# Patient Record
Sex: Male | Born: 1990 | Race: Black or African American | Hispanic: No | Marital: Single | Smoking: Never smoker
Health system: Southern US, Community
[De-identification: ages and names within clinical notes are randomized; demographics above are authoritative.]

## PROBLEM LIST (undated history)

## (undated) DIAGNOSIS — K219 Gastro-esophageal reflux disease without esophagitis: Secondary | ICD-10-CM

## (undated) DIAGNOSIS — R000 Tachycardia, unspecified: Secondary | ICD-10-CM

## (undated) DIAGNOSIS — E785 Hyperlipidemia, unspecified: Secondary | ICD-10-CM

## (undated) HISTORY — DX: Hyperlipidemia, unspecified: E78.5

## (undated) HISTORY — PX: NO PAST SURGERIES: SHX2092

---
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
## 2009-02-12 ENCOUNTER — Emergency Department (HOSPITAL_BASED_OUTPATIENT_CLINIC_OR_DEPARTMENT_OTHER): Admission: EM | Admit: 2009-02-12 | Discharge: 2009-02-12 | Payer: Self-pay | Admitting: Emergency Medicine

## 2009-02-12 ENCOUNTER — Ambulatory Visit: Payer: Self-pay | Admitting: Diagnostic Radiology

## 2009-03-01 ENCOUNTER — Emergency Department (HOSPITAL_BASED_OUTPATIENT_CLINIC_OR_DEPARTMENT_OTHER): Admission: EM | Admit: 2009-03-01 | Discharge: 2009-03-01 | Payer: Self-pay | Admitting: Emergency Medicine

## 2009-03-01 ENCOUNTER — Ambulatory Visit: Payer: Self-pay | Admitting: Diagnostic Radiology

## 2009-11-13 ENCOUNTER — Emergency Department (HOSPITAL_BASED_OUTPATIENT_CLINIC_OR_DEPARTMENT_OTHER): Admission: EM | Admit: 2009-11-13 | Discharge: 2009-11-13 | Payer: Self-pay | Admitting: Emergency Medicine

## 2010-02-07 ENCOUNTER — Emergency Department (HOSPITAL_BASED_OUTPATIENT_CLINIC_OR_DEPARTMENT_OTHER)
Admission: EM | Admit: 2010-02-07 | Discharge: 2010-02-07 | Payer: Self-pay | Source: Home / Self Care | Admitting: Emergency Medicine

## 2010-05-09 LAB — BASIC METABOLIC PANEL
CO2: 30 mEq/L (ref 19–32)
Chloride: 106 mEq/L (ref 96–112)
GFR calc Af Amer: >60 mL/min (ref >60)
GFR calc non Af Amer: >60 mL/min (ref >60)
Glucose, Bld: 82 mg/dL (ref 70–99)
Sodium: 143 mEq/L (ref 135–145)

## 2010-06-01 LAB — CBC
MCHC: 33.8 g/dL (ref 30.0–36.0)
RBC: 5.19 MIL/uL (ref 4.22–5.81)
RDW: 12.7 % (ref 11.5–15.5)

## 2010-06-01 LAB — DIFFERENTIAL
Eosinophils Relative: 2 % (ref 0–5)
Lymphocytes Relative: 32 % (ref 12–46)
Lymphs Abs: 1.2 10*3/uL (ref 0.7–4.0)

## 2010-06-01 LAB — BASIC METABOLIC PANEL
BUN: 16 mg/dL (ref 6–23)
Calcium: 9.6 mg/dL (ref 8.4–10.5)
Chloride: 104 mEq/L (ref 96–112)
Creatinine, Ser: 1 mg/dL (ref 0.4–1.5)
Glucose, Bld: 93 mg/dL (ref 70–99)
Potassium: 4.2 mEq/L (ref 3.5–5.1)

## 2011-08-01 ENCOUNTER — Ambulatory Visit (INDEPENDENT_AMBULATORY_CARE_PROVIDER_SITE_OTHER): Payer: Managed Care, Other (non HMO) | Admitting: Family

## 2011-08-01 ENCOUNTER — Encounter: Payer: Self-pay | Admitting: Family

## 2011-08-01 VITALS — BP 98/52 | HR 60 | Temp 98.2°F | Resp 16 | Ht 69.0 in | Wt 128.1 lb

## 2011-08-01 DIAGNOSIS — Z23 Encounter for immunization: Secondary | ICD-10-CM

## 2011-08-01 DIAGNOSIS — Z0000 Encounter for general adult medical examination without abnormal findings: Secondary | ICD-10-CM | POA: Insufficient documentation

## 2011-08-01 DIAGNOSIS — H6120 Impacted cerumen, unspecified ear: Secondary | ICD-10-CM

## 2011-08-01 NOTE — Assessment & Plan Note (Signed)
Left ear.  Cerumen removed with curette and lavage.

## 2011-08-01 NOTE — Progress Notes (Signed)
  Subjective:    Patient ID: Eric Cisneros, male    DOB: 07/22/1990, 21 y.o.   MRN: [id number]  HPI  Preventative-  Diet is good.  Eats Not sure when his last tetanus was.  Was told that his cholesterol was high.      Review of Systems  Constitutional: Negative for unexpected weight change.  HENT: Negative for hearing loss.   Eyes: Negative for visual disturbance.  Respiratory: Negative for cough and shortness of breath.   Cardiovascular: Negative for chest pain and leg swelling.  Gastrointestinal: Negative for nausea, vomiting and diarrhea.  Genitourinary: Negative for frequency and hematuria.  Musculoskeletal: Negative for myalgias and arthralgias.  Skin: Negative for rash.  Neurological:       Occasionally right lower leg "falls asleep" when he wakes up in AM, last episode 1 month ago  Occasional headaches relieved by asa  Hematological: Negative for adenopathy.  Psychiatric/Behavioral:       Denies depression/anxiety       Objective:   Physical Exam Physical Exam  Constitutional: He is oriented to person, place, and time. He appears well-developed and well-nourished. No distress.  HENT:  Head: Normocephalic and atraumatic.  Right Ear: Tympanic membrane and ear canal normal.  Left Ear: Cerumen impaction noted.  After removal of cerumen with currette and lavage, a normal tympanic membrane and ear canal are revealed.  Mouth/Throat: Oropharynx is clear and moist.  Eyes: Pupils are equal, round, and reactive to light. No scleral icterus.  Neck: Normal range of motion. No thyromegaly present.  Cardiovascular: Normal rate and regular rhythm.   No murmur heard. Pulmonary/Chest: Effort normal and breath sounds normal. No respiratory distress. He has no wheezes. He has no rales. He exhibits no tenderness.  Abdominal: Soft. Bowel sounds are normal. He exhibits no distension and no mass. There is no tenderness. There is no rebound and no guarding.  Musculoskeletal: He exhibits no  edema.  Lymphadenopathy:    He has no cervical adenopathy.  Neurological: He is alert and oriented to person, place, and time. He has normal reflexes. He exhibits normal muscle tone. Coordination normal.  Skin: Skin is warm and dry.  Psychiatric: He has a normal mood and affect. His behavior is normal. Judgment and thought content normal.          Assessment & Plan:          Assessment & Plan:  \

## 2011-08-01 NOTE — Assessment & Plan Note (Signed)
Pt counseled on healthy diet, exercise.  Tetanus today.  Pt will return for fasting lab work.

## 2011-08-01 NOTE — Patient Instructions (Signed)
Please work on a low cholesterol diet. Please return fasting for your blood work one morning next week.  Follow up in 1 year, sooner if problems/concerns.

## 2011-08-05 ENCOUNTER — Telehealth: Payer: Self-pay | Admitting: *Deleted

## 2011-08-05 DIAGNOSIS — Z0000 Encounter for general adult medical examination without abnormal findings: Secondary | ICD-10-CM

## 2011-08-05 NOTE — Telephone Encounter (Signed)
Message copied by Kathi Simpers on Tue Aug 05, 2011  9:40 AM ------      Message from: O'SULLIVAN, MELISSA      Created: Fri Aug 01, 2011  3:22 PM       Pt will return next week for:            BMET, LFT, FLP, CBC, TSH, UA with reflex micro (v70.0) please

## 2011-08-05 NOTE — Telephone Encounter (Signed)
Future lab order placed and given to the lab. 

## 2014-06-26 ENCOUNTER — Telehealth: Payer: Self-pay | Admitting: Family

## 2014-06-26 NOTE — Telephone Encounter (Signed)
Pt was last seen by Melissa O. In 2013 and requesting to establish care with a male physicians. Please advise

## 2014-06-26 NOTE — Telephone Encounter (Signed)
Ok with me 

## 2014-06-26 NOTE — Telephone Encounter (Signed)
That is fine with me.

## 2014-06-29 ENCOUNTER — Telehealth: Payer: Self-pay | Admitting: *Deleted

## 2014-06-29 NOTE — Telephone Encounter (Signed)
Unable to reach patient at time of Pre-Visit Call.    Called cell phone number and a Mr. Smith answered- wrong #. Called house number and left message for return call.

## 2014-06-30 ENCOUNTER — Ambulatory Visit: Payer: Managed Care, Other (non HMO) | Admitting: Medical

## 2014-07-03 ENCOUNTER — Encounter: Payer: Self-pay | Admitting: Medical

## 2014-07-03 ENCOUNTER — Encounter: Payer: Self-pay | Admitting: Family

## 2014-07-03 ENCOUNTER — Telehealth: Payer: Self-pay | Admitting: Family

## 2014-07-03 NOTE — Telephone Encounter (Signed)
Pt was no show for new pt appointment with Esperanza RichtersEdward Saguier on 06/30/14. Letter sent.

## 2014-07-19 ENCOUNTER — Encounter: Payer: Managed Care, Other (non HMO) | Admitting: Family

## 2014-07-20 ENCOUNTER — Telehealth: Payer: Self-pay | Admitting: *Deleted

## 2014-07-20 NOTE — Telephone Encounter (Signed)
Unable to reach patient at time of Pre-Visit Call.  Called cell phone and a man stated that it was the wrong number.   Called home phone and it was a woman's answering machine with no mention of shared phone.   Did not leave voicemail.

## 2014-07-21 ENCOUNTER — Encounter: Payer: Managed Care, Other (non HMO) | Admitting: Family

## 2014-07-21 ENCOUNTER — Ambulatory Visit (INDEPENDENT_AMBULATORY_CARE_PROVIDER_SITE_OTHER): Payer: Self-pay | Admitting: Physician Assistant

## 2014-07-21 ENCOUNTER — Encounter: Payer: Self-pay | Admitting: Physician Assistant

## 2014-07-21 VITALS — BP 120/57 | HR 50 | Temp 98.3°F | Ht 69.25 in | Wt 129.0 lb

## 2014-07-21 DIAGNOSIS — Z0184 Encounter for antibody response examination: Secondary | ICD-10-CM

## 2014-07-21 DIAGNOSIS — Z0000 Encounter for general adult medical examination without abnormal findings: Secondary | ICD-10-CM

## 2014-07-21 LAB — CBC WITH DIFFERENTIAL/PLATELET
BASOS ABS: 0 10*3/uL (ref 0.0–0.1)
BASOS PCT: 1 % (ref 0–1)
EOS ABS: 0.1 10*3/uL (ref 0.0–0.7)
EOS PCT: 2 % (ref 0–5)
HCT: 43.6 % (ref 39.0–52.0)
Hemoglobin: 14.5 g/dL (ref 13.0–17.0)
Lymphocytes Relative: 32 % (ref 12–46)
Lymphs Abs: 1.4 10*3/uL (ref 0.7–4.0)
MCH: 27.8 pg (ref 26.0–34.0)
MCHC: 33.3 g/dL (ref 30.0–36.0)
MCV: 83.7 fL (ref 78.0–100.0)
MONO ABS: 0.4 10*3/uL (ref 0.1–1.0)
MPV: 10.9 fL (ref 8.6–12.4)
Monocytes Relative: 9 % (ref 3–12)
NEUTROS ABS: 2.5 10*3/uL (ref 1.7–7.7)
Neutrophils Relative %: 56 % (ref 43–77)
PLATELETS: 179 10*3/uL (ref 150–400)
RBC: 5.21 MIL/uL (ref 4.22–5.81)
RDW: 14.5 % (ref 11.5–15.5)
WBC: 4.4 10*3/uL (ref 4.0–10.5)

## 2014-07-21 LAB — LIPID PANEL
CHOLESTEROL: 239 mg/dL — AB (ref 0–200)
HDL: 64 mg/dL (ref >=40)
LDL CALC: 159 mg/dL — AB (ref 0–99)
TRIGLYCERIDES: 81 mg/dL (ref <150)
Total CHOL/HDL Ratio: 3.7 Ratio
VLDL: 16 mg/dL (ref 0–40)

## 2014-07-21 LAB — COMPREHENSIVE METABOLIC PANEL
ALT: 13 U/L (ref 0–53)
AST: 21 U/L (ref 0–37)
Albumin: 4.3 g/dL (ref 3.5–5.2)
Alkaline Phosphatase: 52 U/L (ref 39–117)
BUN: 11 mg/dL (ref 6–23)
CALCIUM: 9.4 mg/dL (ref 8.4–10.5)
CHLORIDE: 103 meq/L (ref 96–112)
CO2: 27 mEq/L (ref 19–32)
CREATININE: 0.98 mg/dL (ref 0.50–1.35)
GLUCOSE: 95 mg/dL (ref 70–99)
Potassium: 4.2 mEq/L (ref 3.5–5.3)
SODIUM: 136 meq/L (ref 135–145)
Total Bilirubin: 0.5 mg/dL (ref 0.2–1.2)
Total Protein: 7.3 g/dL (ref 6.0–8.3)

## 2014-07-21 NOTE — Addendum Note (Signed)
Addended by: Silvio PateHOMPSON, Aalina Brege D on: 07/21/2014 04:48 PM   Modules accepted: Orders

## 2014-07-21 NOTE — Progress Notes (Signed)
Pre visit review using our clinic review tool, if applicable. No additional management support is needed unless otherwise documented below in the visit note. 

## 2014-07-21 NOTE — Progress Notes (Signed)
Patient presents to clinic today today for college physical assessment and immunity status testing.  Patient with history of high cholesterol controlled with diet and exercise.  Denies acute concerns today.  Moved from [place] in 2008 and has no immunization records at home or on file. Will be starting school in August.  Past Medical History  Diagnosis Date  . Hyperlipidemia     No current outpatient prescriptions on file prior to visit.   No current facility-administered medications on file prior to visit.    No Known Allergies  Family History  Problem Relation Age of Onset  . Hyperlipidemia Mother   . Hypertension Father     History   Social History  . Marital Status: Single    Spouse Name: N/A  . Number of Children: N/A  . Years of Education: N/A   Social History Main Topics  . Smoking status: Never Smoker   . Smokeless tobacco: Never Used  . Alcohol Use: No  . Drug Use: No  . Sexual Activity:    Partners: Female     Comment: 3 partners last 6 months. uses condoms   Other Topics Concern  . None   Social History Narrative   Lives with parents, 2 sisters and niece   Moved here from [place] at a hotel resort.    Completed HS   Enjoys basketball   Enjoys writing.    Review of Systems  Constitutional: Negative for fever and weight loss.  HENT: Negative for ear discharge, ear pain, hearing loss and tinnitus.   Eyes: Negative for blurred vision, double vision, photophobia and pain.  Respiratory: Negative for cough and shortness of breath.   Cardiovascular: Negative for chest pain and palpitations.  Gastrointestinal: Negative for heartburn, nausea, vomiting, abdominal pain, diarrhea, constipation, blood in stool and melena.  Genitourinary: Negative for dysuria, urgency, frequency, hematuria and flank pain.  Musculoskeletal: Negative for falls.  Neurological: Negative for dizziness, loss of consciousness and headaches.  Endo/Heme/Allergies: Negative for  environmental allergies.  Psychiatric/Behavioral: Negative for depression, suicidal ideas, hallucinations and substance abuse. The patient is not nervous/anxious and does not have insomnia.      BP 120/57 mmHg  Pulse 50  Temp(Src) 98.3 F (36.8 C) (Oral)  Ht 5' 9.25" (1.759 m)  Wt 129 lb (58.514 kg)  BMI 18.91 kg/m2  SpO2 100%  Physical Exam  Constitutional: He is oriented to person, place, and time and well-developed, well-nourished, and in no distress.  HENT:  Head: Normocephalic and atraumatic.  Right Ear: External ear normal.  Left Ear: External ear normal.  Nose: Nose normal.  Mouth/Throat: Oropharynx is clear and moist. No oropharyngeal exudate.  Eyes: Conjunctivae and EOM are normal. Pupils are equal, round, and reactive to light.  Neck: Neck supple. No thyromegaly present.  Cardiovascular: Normal rate, regular rhythm, normal heart sounds and intact distal pulses.   Pulmonary/Chest: Effort normal and breath sounds normal. No respiratory distress. He has no wheezes. He has no rales. He exhibits no tenderness.  Abdominal: Soft. Bowel sounds are normal. He exhibits no distension and no mass. There is no tenderness. There is no rebound and no guarding.  Genitourinary: Testes/scrotum normal and penis normal. No discharge found.  Lymphadenopathy:    He has no cervical adenopathy.  Neurological: He is alert and oriented to person, place, and time.  Skin: Skin is warm and dry. No rash noted.  Psychiatric: Affect normal.  Vitals reviewed.  Assessment/Plan: Visit for preventive health examination Depression screen negative. TDaP  up-to-date.  Health Maintenance reviewed.  We will check fasting labs today. Preventive schedule reviewed. Handout given in AVS.  Will follow-up based on results.   Immunity status testing Will check immunity to Varicella, MMR and Hep B.  Will also perform sickle cell testing per school requirements.

## 2014-07-21 NOTE — Patient Instructions (Signed)
Please go to the lab for blood work.  I will call you with your results.  Call the [facility] to get copies of old immunizations.  Your school needs this.

## 2014-07-21 NOTE — Assessment & Plan Note (Signed)
Depression screen negative. TDaP up-to-date.  Health Maintenance reviewed.  We will check fasting labs today. Preventive schedule reviewed. Handout given in AVS.  Will follow-up based on results.

## 2014-07-21 NOTE — Assessment & Plan Note (Signed)
Will check immunity to Varicella, MMR and Hep B.  Will also perform sickle cell testing per school requirements.

## 2014-07-22 LAB — URINALYSIS, ROUTINE W REFLEX MICROSCOPIC
BILIRUBIN URINE: NEGATIVE
Glucose, UA: NEGATIVE mg/dL
HGB URINE DIPSTICK: NEGATIVE
Ketones, ur: NEGATIVE mg/dL
Leukocytes, UA: NEGATIVE
NITRITE: NEGATIVE
PH: 5.5 (ref 5.0–8.0)
PROTEIN: NEGATIVE mg/dL
Specific Gravity, Urine: 1.022 (ref 1.005–1.030)
Urobilinogen, UA: 1 mg/dL (ref 0.0–1.0)

## 2014-07-22 LAB — HEPATITIS B SURFACE ANTIBODY, QUANTITATIVE: Hepatitis B-Post: 20.1 m[IU]/mL

## 2014-07-22 LAB — SICKLE CELL SCREEN: SICKLE CELL SCREEN: NEGATIVE

## 2014-07-24 LAB — MEASLES/MUMPS/RUBELLA IMMUNITY
Mumps IgG: 50.4 AU/mL — ABNORMAL HIGH (ref <9.00)
RUBELLA: 3.57 {index} — AB (ref <0.90)
RUBEOLA IGG: 24 [AU]/ml (ref <25.00)

## 2014-10-17 ENCOUNTER — Ambulatory Visit: Payer: Self-pay | Admitting: Physician Assistant

## 2014-10-25 ENCOUNTER — Encounter: Payer: Self-pay | Admitting: Physician Assistant

## 2014-10-25 ENCOUNTER — Ambulatory Visit (INDEPENDENT_AMBULATORY_CARE_PROVIDER_SITE_OTHER): Payer: BLUE CROSS/BLUE SHIELD | Admitting: Physician Assistant

## 2014-10-25 VITALS — BP 102/74 | HR 55 | Temp 98.3°F | Resp 16 | Ht 69.0 in | Wt 138.2 lb

## 2014-10-25 DIAGNOSIS — K219 Gastro-esophageal reflux disease without esophagitis: Secondary | ICD-10-CM | POA: Diagnosis not present

## 2014-10-25 DIAGNOSIS — I10 Essential (primary) hypertension: Secondary | ICD-10-CM

## 2014-10-25 MED ORDER — OMEPRAZOLE 20 MG PO CPDR: 20.0000 mg | DELAYED_RELEASE_CAPSULE | Freq: Every day | ORAL | Status: DC

## 2014-10-25 NOTE — Progress Notes (Signed)
Pre visit review using our clinic review tool, if applicable. No additional management support is needed unless otherwise documented below in the visit note/SLS  

## 2014-10-25 NOTE — Assessment & Plan Note (Signed)
Asymptomatic presently. Night time is where this is most pronounced. Poor diet. EKG obtained due to discomfort with symptoms -- shows some ST repolarization. Reviewed by Supervising MD Beverely Low who agrees normal variant. Will begin Prilosec 20 mg daily. Can use OTC Zantac BID until PPI reaches therapeutic effect. Follow-up 2 weeks. GERD diet discussed and handout given.

## 2014-10-25 NOTE — Progress Notes (Signed)
    Patient presents to clinic today c/o heart burn and reflux over the past 3-4 weeks worse at night and when laying down. Endorses eating last meal/snack around midnight right before going to bed. Is woken up with heartburn and discomfort in epigastric region. Denies SOB, chest pain, palpitations. Denies ST or dysphagia. Denies tenesmus, melena or hematochezia. Has taken TUMS with mild relief. Mother is present and notes patient with hx of Kawasaki disease.  Past Medical History  Diagnosis Date  . Hyperlipidemia     No current outpatient prescriptions on file prior to visit.   No current facility-administered medications on file prior to visit.    No Known Allergies  Family History  Problem Relation Age of Onset  . Hyperlipidemia Mother   . Hypertension Father     Social History   Social History  . Marital Status: Single    Spouse Name: N/A  . Number of Children: N/A  . Years of Education: N/A   Social History Main Topics  . Smoking status: Never Smoker   . Smokeless tobacco: Never Used  . Alcohol Use: No  . Drug Use: No  . Sexual Activity:    Partners: Female     Comment: 3 partners last 6 months. uses condoms   Other Topics Concern  . None   Social History Narrative   Lives with parents, 2 sisters and niece   Moved here from [place] at a hotel resort.    Completed HS   Enjoys basketball   Enjoys writing.     Review of Systems - See HPI.  All other ROS are negative.  BP 102/74 mmHg  Pulse 55  Temp(Src) 98.3 F (36.8 C) (Oral)  Resp 16  Ht  (1.753 m)  Wt 138 lb 4 oz (62.71 kg)  BMI 20.41 kg/m2  SpO2 98%  Physical Exam  Constitutional: He is well-developed, well-nourished, and in no distress.  HENT:  Head: Normocephalic and atraumatic.  Eyes: Conjunctivae are normal.  Neck: Neck supple.  Cardiovascular: Normal rate, regular rhythm, normal heart sounds and intact distal pulses.   Pulmonary/Chest: Effort normal and breath sounds normal. No  respiratory distress. He has no wheezes. He has no rales. He exhibits no tenderness.  Abdominal: Soft. Bowel sounds are normal. There is no tenderness.  Skin: Skin is warm. No rash noted.  Psychiatric: Affect normal.    No results found for this or any previous visit (from the past 2160 hour(s)).  Assessment/Plan: Gastroesophageal reflux disease without esophagitis Asymptomatic presently. Night time is where this is most pronounced. Poor diet. EKG obtained due to discomfort with symptoms -- shows some ST repolarization. Reviewed by Supervising MD Beverely Low who agrees normal variant. Will begin Prilosec 20 mg daily. Can use OTC Zantac BID until PPI reaches therapeutic effect. Follow-up 2 weeks. GERD diet discussed and handout given.

## 2014-10-25 NOTE — Patient Instructions (Addendum)
Please stay well hydrated.  Decrease intake of fried foods. Avoid late-night eating. Take Prilosec daily as directed. It will take a couple of days to start working in your system so in the mean time, take an OTC Zantac 75 mg twice daily.  Follow-up with me in 2 weeks. Return sooner if anything worsens.  Food Choices for Gastroesophageal Reflux Disease When you have gastroesophageal reflux disease (GERD), the foods you eat and your eating habits are very important. Choosing the right foods can help ease the discomfort of GERD. WHAT GENERAL GUIDELINES DO I NEED TO FOLLOW?  Choose fruits, vegetables, whole grains, low-fat dairy products, and low-fat meat, fish, and poultry.  Limit fats such as oils, salad dressings, butter, nuts, and avocado.  Keep a food diary to identify foods that cause symptoms.  Avoid foods that cause reflux. These may be different for different people.  Eat frequent small meals instead of three large meals each day.  Eat your meals slowly, in a relaxed setting.  Limit fried foods.  Cook foods using methods other than frying.  Avoid drinking alcohol.  Avoid drinking large amounts of liquids with your meals.  Avoid bending over or lying down until 2-3 hours after eating. WHAT FOODS ARE NOT RECOMMENDED? The following are some foods and drinks that may worsen your symptoms: Vegetables Tomatoes. Tomato juice. Tomato and spaghetti sauce. Chili peppers. Onion and garlic. Horseradish. Fruits Oranges, grapefruit, and lemon (fruit and juice). Meats High-fat meats, fish, and poultry. This includes hot dogs, ribs, ham, sausage, salami, and bacon. Dairy Whole milk and chocolate milk. Sour cream. Cream. Butter. Ice cream. Cream cheese.  Beverages Coffee and tea, with or without caffeine. Carbonated beverages or energy drinks. Condiments Hot sauce. Barbecue sauce.  Sweets/Desserts Chocolate and cocoa. Donuts. Peppermint and spearmint. Fats and Oils High-fat  foods, including [place] fries and potato chips. Other Vinegar. Strong spices, such as black pepper, white pepper, red pepper, cayenne, curry powder, cloves, ginger, and chili powder. The items listed above may not be a complete list of foods and beverages to avoid. Contact your dietitian for more information. Document Released: 02/10/2005 Document Revised: 02/15/2013 Document Reviewed: 12/15/2012 [facility] Patient Information 2015 [place], [place]. This information is not intended to replace advice given to you by your health care provider. Make sure you discuss any questions you have with your health care provider.

## 2015-02-06 ENCOUNTER — Telehealth: Payer: Self-pay | Admitting: Physician Assistant

## 2015-02-06 NOTE — Telephone Encounter (Signed)
LM for pt to call and schedule flu shot or update records. °

## 2015-04-14 ENCOUNTER — Ambulatory Visit: Payer: BLUE CROSS/BLUE SHIELD

## 2015-04-14 ENCOUNTER — Ambulatory Visit (HOSPITAL_COMMUNITY): Payer: Self-pay

## 2015-04-14 ENCOUNTER — Ambulatory Visit (INDEPENDENT_AMBULATORY_CARE_PROVIDER_SITE_OTHER): Payer: BLUE CROSS/BLUE SHIELD

## 2015-04-14 ENCOUNTER — Ambulatory Visit (INDEPENDENT_AMBULATORY_CARE_PROVIDER_SITE_OTHER): Payer: Self-pay | Admitting: Family Medicine

## 2015-04-14 VITALS — BP 124/80 | HR 69 | Temp 98.0°F | Resp 17 | Ht 68.5 in | Wt 146.0 lb

## 2015-04-14 DIAGNOSIS — H6122 Impacted cerumen, left ear: Secondary | ICD-10-CM

## 2015-04-14 DIAGNOSIS — R079 Chest pain, unspecified: Secondary | ICD-10-CM | POA: Diagnosis not present

## 2015-04-14 MED ORDER — MELOXICAM 7.5 MG PO TABS: 7.5000 mg | ORAL_TABLET | Freq: Every day | ORAL | Status: DC

## 2015-04-14 NOTE — Progress Notes (Addendum)
 @  By signing my name below, I, Raven Small, attest that this documentation has been prepared under the direction and in the presence of Elvina Sidle, MD.  Electronically Signed: Andrew Au, ED Scribe. 04/14/2015. 12:40 PM.  Patient ID: Eric Cisneros MRN: [id number], DOB: October 06, 1990, 25 y.o. Date of Encounter: 04/14/2015, 12:40 PM  Primary Physician: Piedad Climes, PA-C  Chief Complaint:  Chief Complaint  Patient presents with  . Chest Pain  . Arm Pain    HPI: 25 y.o. year old male with history below presents with intermittent, sharp, left sided CP with shooting pain down left arm and left jaw that began a couple days ago. He rates pain 7/10. He denies pain with certain movement, deep breaths and eating. Pain does not wake him up during the night but he will wake up in the morning with CP.  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 He reports similar pain in the past and has been told to monitor this problem. He denies fever, nausea and SOB.  He denies known family hx of heart disease or heart problems.   Pt is a Marine scientist.   Past Medical History  Diagnosis Date  . Hyperlipidemia      Home Meds: Prior to Admission medications   Not on File    Allergies: No Known Allergies  Social History   Social History  . Marital Status: Single    Spouse Name: N/A  . Number of Children: N/A  . Years of Education: N/A   Occupational History  . Not on file.   Social History Main Topics  . Smoking status: Never Smoker   . Smokeless tobacco: Never Used  . Alcohol Use: No  . Drug Use: No  . Sexual Activity:    Partners: Female     Comment: 3 partners last 6 months. uses condoms   Other Topics Concern  . Not on file   Social History Narrative   Lives with parents, 2 sisters and niece   [place] here from [place] at a hotel resort.    Completed HS   Enjoys basketball   Enjoys writing.     Review of Systems: Constitutional: negative for chills,  fever, night sweats, weight changes, or fatigue  HEENT: negative for vision changes, hearing loss, congestion, rhinorrhea, ST, epistaxis, or sinus pressure Cardiovascular: negative for palpitations Respiratory: negative for hemoptysis, wheezing, shortness of breath, or cough Abdominal: negative for abdominal pain, nausea, vomiting, diarrhea, or constipation Dermatological: negative for rash Neurologic: negative for headache, dizziness, or syncope All other systems reviewed and are otherwise negative with the exception to those above and in the HPI.   Physical Exam: Blood pressure 124/80, pulse 69, temperature 98 F (36.7 C), temperature source Oral, resp. rate 17, height 5' 8.5" (1.74 m), weight 146 lb (66.225 kg), SpO2 95 %., Body mass index is 21.87 kg/(m^2). General: Well developed, well nourished, in no acute distress. Head: Normocephalic, atraumatic, eyes without discharge, sclera non-icteric, nares are without discharge. Left cerumen impaction Oral cavity moist, posterior pharynx without exudate, erythema, peritonsillar abscess, or post nasal drip.  Neck: Supple. No thyromegaly. Full ROM. No lymphadenopathy. Lungs: Clear bilaterally to auscultation without wheezes, rales, or rhonchi. Breathing is unlabored. Heart: RRR with S1 S2. Grade 1/6 murmurs. No rubs, or gallops appreciated. Abdomen: Soft, non-tender, non-distended with normoactive bowel sounds. No hepatomegaly. No rebound/guarding. No obvious abdominal masses. Msk:  Strength and tone normal for age. Extremities/Skin: Warm and dry. No clubbing or cyanosis. No  edema. No rashes or suspicious lesions. Neuro: Alert and oriented X 3. Moves all extremities spontaneously. Gait is normal. CNII-XII grossly in tact. Psych:  Responds to questions appropriately with a normal affect.   CXR- normal pulmonary and cardiac shadows, normal ribs and spine  EKG- Early repolarization on lateral precordial leads.    ASSESSMENT AND PLAN:  25 y.o.  year old male with  This chart was scribed in my presence and reviewed by me personally.    ICD-9-CM ICD-10-CM   1. Chest pain, unspecified chest pain type 786.50 R07.9 EKG 12-Lead     DG Chest 2 View     Ambulatory referral to Cardiology     DG Chest 2 View     DG Chest 2 View     DG Chest 2 View     meloxicam (MOBIC) 7.5 MG tablet  2. Cerumen impaction, left 380.4 H61.22 Ear wax removal     DG Chest 2 View     Signed, Elvina Sidle, MD    Signed, Elvina Sidle, MD 04/14/2015 12:40 PM

## 2015-04-14 NOTE — Patient Instructions (Addendum)
Because you received an x-ray today, you will receive an invoice from [facility]. Please contact [facility] at [phone] with questions or concerns regarding your invoice. Our billing staff will not be able to assist you with those questions.   Chest Wall Pain Chest wall pain is pain in or around the bones and muscles of your chest. Sometimes, an injury causes this pain. Sometimes, the cause may not be known. This pain may take several weeks or longer to get better. HOME CARE INSTRUCTIONS  Pay attention to any changes in your symptoms. Take these actions to help with your pain:   Rest as told by your health care provider.   Avoid activities that cause pain. These include any activities that use your chest muscles or your abdominal and side muscles to lift heavy items.   If directed, apply ice to the painful area:  Put ice in a plastic bag.  Place a towel between your skin and the bag.  Leave the ice on for 20 minutes, 2-3 times per day.  Take over-the-counter and prescription medicines only as told by your health care provider.  Do not use tobacco products, including cigarettes, chewing tobacco, and e-cigarettes. If you need help quitting, ask your health care provider.  Keep all follow-up visits as told by your health care provider. This is important. SEEK MEDICAL CARE IF:  You have a fever.  Your chest pain becomes worse.  You have new symptoms. SEEK IMMEDIATE MEDICAL CARE IF:  You have nausea or vomiting.  You feel sweaty or light-headed.  You have a cough with phlegm (sputum) or you cough up blood.  You develop shortness of breath.   This information is not intended to replace advice given to you by your health care provider. Make sure you discuss any questions you have with your health care provider.   Document Released: 02/10/2005 Document Revised: 11/01/2014 Document Reviewed: 05/08/2014 Elsevier Interactive Patient Education [employer].

## 2015-05-02 ENCOUNTER — Telehealth: Payer: Self-pay | Admitting: Physician Assistant

## 2015-05-02 NOTE — Telephone Encounter (Signed)
Patient declined flu shot  °

## 2015-05-02 NOTE — Telephone Encounter (Signed)
Record flu declined.//AB/CMA

## 2015-05-06 ENCOUNTER — Emergency Department (HOSPITAL_COMMUNITY): Payer: 59

## 2015-05-06 ENCOUNTER — Emergency Department (HOSPITAL_COMMUNITY)
Admission: EM | Admit: 2015-05-06 | Discharge: 2015-05-06 | Disposition: A | Discharge status: Home / Self Care | Payer: 59 | Attending: Emergency Medicine | Admitting: Emergency Medicine

## 2015-05-06 ENCOUNTER — Encounter (HOSPITAL_COMMUNITY): Payer: Self-pay | Admitting: *Deleted

## 2015-05-06 DIAGNOSIS — Z8639 Personal history of other endocrine, nutritional and metabolic disease: Secondary | ICD-10-CM | POA: Insufficient documentation

## 2015-05-06 DIAGNOSIS — R079 Chest pain, unspecified: Secondary | ICD-10-CM | POA: Diagnosis present

## 2015-05-06 DIAGNOSIS — R0789 Other chest pain: Secondary | ICD-10-CM | POA: Diagnosis not present

## 2015-05-06 DIAGNOSIS — Z791 Long term (current) use of non-steroidal anti-inflammatories (NSAID): Secondary | ICD-10-CM | POA: Diagnosis not present

## 2015-05-06 DIAGNOSIS — R0602 Shortness of breath: Secondary | ICD-10-CM | POA: Diagnosis not present

## 2015-05-06 DIAGNOSIS — R42 Dizziness and giddiness: Secondary | ICD-10-CM | POA: Diagnosis not present

## 2015-05-06 LAB — I-STAT CHEM 8, ED
BUN: 12 mg/dL (ref 6–20)
CREATININE: 1 mg/dL (ref 0.61–1.24)
Calcium, Ion: 1.11 mmol/L — ABNORMAL LOW (ref 1.12–1.23)
Chloride: 102 mmol/L (ref 101–111)
GLUCOSE: 100 mg/dL — AB (ref 65–99)
HEMATOCRIT: 48 % (ref 39.0–52.0)
HEMOGLOBIN: 16.3 g/dL (ref 13.0–17.0)
POTASSIUM: 3.6 mmol/L (ref 3.5–5.1)
Sodium: 139 mmol/L (ref 135–145)
TCO2: 23 mmol/L (ref 0–100)

## 2015-05-06 LAB — I-STAT TROPONIN, ED
TROPONIN I, POC: 0 ng/mL (ref 0.00–0.08)
TROPONIN I, POC: 0 ng/mL (ref 0.00–0.08)

## 2015-05-06 NOTE — ED Notes (Signed)
Phlebotomy at bedside.

## 2015-05-06 NOTE — ED Notes (Signed)
Phlebotomy called about istat trop

## 2015-05-06 NOTE — ED Provider Notes (Signed)
CSN: [id number]     Arrival date & time 05/06/15  [id number] History   First MD Initiated Contact with Patient 05/06/15 [phone]     Chief Complaint  Patient presents with  . Chest Pain     (Consider location/radiation/quality/duration/timing/severity/associated sxs/prior Treatment) HPI Patient awakened 7 AM today feeling short of breath, lightheaded and feeling of vertigo. He also reports he has had chest pain daily for the past 7 years lasting 2 minutes at a time which is left-sided anterior and nonradiating. Pain is nonexertional and not made worse or improved by anything. He's [facility] years ago for chest pain and told that he had "a heart murmur" he was brought by EMS EMS treated patient with one sublingual nitroglycerin and aspirin 325 mg. No other associated symptoms no sweatiness or nausea Past Medical History  Diagnosis Date  . Hyperlipidemia    Past Surgical History  Procedure Laterality Date  . No past surgeries     Family History  Problem Relation Age of Onset  . Hyperlipidemia Mother   . Hypertension Father    Social History  Substance Use Topics  . Smoking status: Never Smoker   . Smokeless tobacco: Never Used  . Alcohol Use: No    ex-smoker quit 2 months ago no alcohol no drugs Review of Systems  Constitutional: Negative.   HENT: Negative.   Respiratory: Positive for shortness of breath.   Cardiovascular: Positive for chest pain.  Gastrointestinal: Negative.   Musculoskeletal: Negative.   Skin: Negative.   Neurological: Positive for dizziness and light-headedness.  Psychiatric/Behavioral: Negative.   All other systems reviewed and are negative.     Allergies  Review of patient's allergies indicates no known allergies.  Home Medications   Prior to Admission medications   Medication Sig Start Date End Date Taking? Authorizing Provider  meloxicam (MOBIC) 7.5 MG tablet Take 1 tablet (7.5 mg total) by mouth daily. 04/14/15   Elvina SidleKurt  Lauenstein, MD   BP 119/86 mmHg  Pulse 69  Resp 13  SpO2 99% Physical Exam  Constitutional: He appears well-developed and well-nourished.  HENT:  Head: Normocephalic and atraumatic.  Eyes: Conjunctivae are normal. Pupils are equal, round, and reactive to light.  Neck: Neck supple. No tracheal deviation present. No thyromegaly present.  Cardiovascular: Normal rate and regular rhythm.   No murmur heard. Pulmonary/Chest: Effort normal and breath sounds normal.  Abdominal: Soft. Bowel sounds are normal. He exhibits no distension. There is no tenderness.  Musculoskeletal: Normal range of motion. He exhibits no edema or tenderness.  Neurological: He is alert. Coordination normal.  Skin: Skin is warm and dry. No rash noted.  Psychiatric: He has a normal mood and affect.  Nursing note and vitals reviewed.   ED Course  Procedures (including critical care time) Labs Review Labs Reviewed  I-STAT CHEM 8, ED  I-STAT TROPOININ, ED    Imaging Review No results found. I have personally reviewed and evaluated these images and lab results as part of my medical decision-making.   EKG Interpretation   Date/Time:  Sunday May 06 2015 09:33:17 EDT Ventricular Rate:  68 PR Interval:  164 QRS Duration: 87 QT Interval:  372 QTC Calculation: 396 R Axis:   79 Text Interpretation:  Sinus rhythm LVH by voltage Inferior infarct, acute  (LCx) ST elevation, consider anterior injury early repolarization No  significant change since last tracing Confirmed by Ethelda ChickJACUBOWITZ  MD, Kaytlen Lightsey  [phone]([id number]) on 05/06/2015 9:43:36 AM     2:50  PM patient remains asymptomatic. Chest x-ray viewed by me Results for orders placed or performed during the hospital encounter of 05/06/15  I-stat chem 8, ed  Result Value Ref Range   Sodium 139 135 - 145 mmol/L   Potassium 3.6 3.5 - 5.1 mmol/L   Chloride 102 101 - 111 mmol/L   BUN 12 6 - 20 mg/dL   Creatinine, Ser 1.61 0.61 - 1.24 mg/dL   Glucose, Bld 096 (H) 65 - 99 mg/dL    Calcium, Ion 0.45 (L) 1.12 - 1.23 mmol/L   TCO2 23 0 - 100 mmol/L   Hemoglobin 16.3 13.0 - 17.0 g/dL   HCT 40.9 81.1 - 91.4 %  I-stat troponin, ED  Result Value Ref Range   Troponin i, poc 0.00 0.00 - 0.08 ng/mL   Comment 3          I-stat troponin, ED  Result Value Ref Range   Troponin i, poc 0.00 0.00 - 0.08 ng/mL   Comment 3           Dg Chest 2 View  05/06/2015  CLINICAL DATA:  Question left-sided chest pain and shortness of breath. EXAM: CHEST - 2 VIEW COMPARISON:  None. FINDINGS: The heart size and mediastinal contours are within normal limits. Both lungs are clear. The visualized skeletal structures are unremarkable. IMPRESSION: Negative two view chest x-ray Electronically Signed   By: Marin Roberts M.D.   On: 05/06/2015 10:41   Dg Chest 2 View  04/14/2015  CLINICAL DATA:  Left chest pain. EXAM: CHEST  2 VIEW COMPARISON:  10/13/2008. FINDINGS: The heart size and mediastinal contours are within normal limits. Both lungs are clear. The visualized skeletal structures are unremarkable. IMPRESSION: Normal examination. Electronically Signed   By: Beckie Salts M.D.   On: 04/14/2015 12:55    MDM  Strongly doubt acute coronary syndrome. Highly atypical symptoms. Heart score equals 2. Plan referral [place] [facility]  Diagnosis atypical chest pain  Final diagnoses:  None        Doug Sou, MD 05/06/15 1453

## 2015-05-06 NOTE — ED Notes (Signed)
Pt arrives from home via [facility]. Pt states he woke up with crushing left sided non radiation CP. Pt has no other sx. Pt took 325mg  of ASA and received 1 nitro pta.

## 2015-05-06 NOTE — Discharge Instructions (Signed)
Nonspecific Chest Pain Call the [place] in [facility] tomorrow to schedule an appointment to get a primary care physician. Return if your condition worsens or if concerned for any reason It is often hard to find the cause of chest pain. There is always a chance that your pain could be related to something serious, such as a heart attack or a blood clot in your lungs. Chest pain can also be caused by conditions that are not life-threatening. If you have chest pain, it is very important to follow up with your doctor.  HOME CARE  If you were prescribed an antibiotic medicine, finish it all even if you start to feel better.  Avoid any activities that cause chest pain.  Do not use any tobacco products, including cigarettes, chewing tobacco, or electronic cigarettes. If you need help quitting, ask your doctor.  Do not drink alcohol.  Take medicines only as told by your doctor.  Keep all follow-up visits as told by your doctor. This is important. This includes any further testing if your chest pain does not go away.  Your doctor may tell you to keep your head raised (elevated) while you sleep.  Make lifestyle changes as told by your doctor. These may include:  Getting regular exercise. Ask your doctor to suggest some activities that are safe for you.  Eating a heart-healthy diet. Your doctor or a diet specialist (dietitian) can help you to learn healthy eating options.  Maintaining a healthy weight.  Managing diabetes, if necessary.  Reducing stress. GET HELP IF:  Your chest pain does not go away, even after treatment.  You have a rash with blisters on your chest.  You have a fever. GET HELP RIGHT AWAY IF:  Your chest pain is worse.  You have an increasing cough, or you cough up blood.  You have severe belly (abdominal) pain.  You feel extremely weak.  You pass out (faint).  You have chills.  You have sudden, unexplained chest discomfort.  You have  sudden, unexplained discomfort in your arms, back, neck, or jaw.  You have shortness of breath at any time.  You suddenly start to sweat, or your skin gets clammy.  You feel nauseous.  You vomit.  You suddenly feel light-headed or dizzy.  Your heart begins to beat quickly, or it feels like it is skipping beats. These symptoms may be an emergency. Do not wait to see if the symptoms will go away. Get medical help right away. Call your local emergency services (911 in the [place] Do not drive yourself to the hospital.   This information is not intended to replace advice given to you by your health care provider. Make sure you discuss any questions you have with your health care provider.   Document Released: 07/30/2007 Document Revised: 03/03/2014 Document Reviewed: 09/16/2013 Elsevier Interactive Patient Education [employer]2016 Elsevier Inc.

## 2015-05-06 NOTE — ED Notes (Signed)
Pt is in stable condition upon d/c and ambulates from ED. 

## 2015-05-06 NOTE — ED Notes (Signed)
Called mini lab about istat trop results

## 2015-05-07 ENCOUNTER — Encounter: Payer: Self-pay | Admitting: Cardiovascular Disease

## 2015-05-07 ENCOUNTER — Encounter: Payer: Self-pay | Admitting: *Deleted

## 2015-05-07 NOTE — Progress Notes (Signed)
This encounter was created in error - please disregard.

## 2015-05-14 ENCOUNTER — Encounter: Payer: Self-pay | Admitting: Cardiovascular Disease

## 2015-05-18 ENCOUNTER — Encounter: Payer: Self-pay | Admitting: Physician Assistant

## 2015-05-18 ENCOUNTER — Ambulatory Visit (INDEPENDENT_AMBULATORY_CARE_PROVIDER_SITE_OTHER): Payer: 59 | Admitting: Physician Assistant

## 2015-05-18 VITALS — BP 110/68 | HR 92 | Temp 97.6°F | Ht 68.5 in | Wt 145.6 lb

## 2015-05-18 DIAGNOSIS — R0789 Other chest pain: Secondary | ICD-10-CM

## 2015-05-18 MED ORDER — OMEPRAZOLE 20 MG PO CPDR
20.0000 mg | DELAYED_RELEASE_CAPSULE | Freq: Every day | ORAL | Status: DC
Start: 2015-05-18 — End: 2016-01-02

## 2015-05-18 NOTE — Patient Instructions (Signed)
Please start the Prilosec as directed for acid reflux. Follow the diet guidelines below. Stay well hydrated.  I am setting you up with Cardiology for further workup just to be on the safe side.  If symptoms recur and are significant, please go to the ER.

## 2015-05-18 NOTE — Progress Notes (Signed)
Patient presents to clinic today for ER follow-up of atypical chest pain. Patient endorses lightheadedness, dizziness and chest pain with SOB. Proceeded to ER as he was concerned of a heart attack. Workup included Troponins, EKG and CXR performed. EKG noted elevated ST on strip but read from ER physician as NSTEMI. Troponin and CXR unremarkable. Patient discharged.   Since ER visit, patient endorses left-sided and sternal chest pain daily, lasting about 1 minute before resolving. Patient endorses symptoms occurring at rest.  Denies palpitations, lightheadedness or dizziness. Does endorse daily heart burn. Endorses some generalized anxiety without panic attacks. Denies alcohol use or recreational drug use. Denies family history of CAD.   Past Medical History  Diagnosis Date  . Hyperlipidemia     Current Outpatient Prescriptions on File Prior to Visit  Medication Sig Dispense Refill  . Multiple Vitamin (MULTIVITAMIN) tablet Take 1 tablet by mouth daily.     No current facility-administered medications on file prior to visit.    No Known Allergies  Family History  Problem Relation Age of Onset  . Hyperlipidemia Mother   . Hypertension Father     Social History   Social History  . Marital Status: Single    Spouse Name: N/A  . Number of Children: N/A  . Years of Education: N/A   Social History Main Topics  . Smoking status: Never Smoker   . Smokeless tobacco: Never Used  . Alcohol Use: No  . Drug Use: No  . Sexual Activity:    Partners: Female     Comment: 3 partners last 6 months. uses condoms   Other Topics Concern  . None   Social History Narrative   Lives with parents, 2 sisters and niece   Moved here from [place] at a hotel resort.    Completed HS   Enjoys basketball   Enjoys writing.    Review of Systems - See HPI.  All other ROS are negative.  BP 110/68 mmHg  Pulse 92  Temp(Src) 97.6 F (36.4 C) (Oral)  Ht 5' 8.5" (1.74 m)  Wt 145 lb 9.6 oz (66.044 kg)   BMI 21.81 kg/m2  SpO2 99%  Physical Exam  Constitutional: He is oriented to person, place, and time and well-developed, well-nourished, and in no distress.  HENT:  Head: Normocephalic and atraumatic.  Eyes: Conjunctivae are normal.  Neck: Neck supple. No thyromegaly present.  Cardiovascular: Normal rate, regular rhythm, normal heart sounds and intact distal pulses.   Pulmonary/Chest: Effort normal and breath sounds normal. No respiratory distress. He has no wheezes. He has no rales. He exhibits tenderness.  Neurological: He is alert and oriented to person, place, and time.  Skin: Skin is warm and dry. No rash noted.  Psychiatric: Affect normal.  Vitals reviewed.   Recent Results (from the past 2160 hour(s))  I-stat troponin, ED     Status: None   Collection Time: 05/06/15 10:26 AM  Result Value Ref Range   Troponin i, poc 0.00 0.00 - 0.08 ng/mL   Comment 3            Comment: Due to the release kinetics of cTnI, a negative result within the first hours of the onset of symptoms does not rule out myocardial infarction with certainty. If myocardial infarction is still suspected, repeat the test at appropriate intervals.   I-stat chem 8, ed     Status: Abnormal   Collection Time: 05/06/15 10:28 AM  Result Value Ref Range   Sodium 139 135 -  145 mmol/L   Potassium 3.6 3.5 - 5.1 mmol/L   Chloride 102 101 - 111 mmol/L   BUN 12 6 - 20 mg/dL   Creatinine, Ser 1.611.00 0.61 - 1.24 mg/dL   Glucose, Bld 096100 (H) 65 - 99 mg/dL   Calcium, Ion 0.451.11 (L) 1.12 - 1.23 mmol/L   TCO2 23 0 - 100 mmol/L   Hemoglobin 16.3 13.0 - 17.0 g/dL   HCT 40.948.0 81.139.0 - 91.452.0 %  I-stat troponin, ED     Status: None   Collection Time: 05/06/15  2:24 PM  Result Value Ref Range   Troponin i, poc 0.00 0.00 - 0.08 ng/mL   Comment 3            Comment: Due to the release kinetics of cTnI, a negative result within the first hours of the onset of symptoms does not rule out myocardial infarction with certainty. If  myocardial infarction is still suspected, repeat the test at appropriate intervals.     Assessment/Plan: Atypical chest pain Repeat EKG today revealing sinus bradycardia. There are GERD symptoms as well as signs of mild chondritis on exam. Will begin Prilosec daily. Discussed supportive measures and OTC medications for chondritis. Will refer to Cardiology for stress test giving these chronic symptoms to verify nothing further is needed from cardiac standpoint. Alarm signs/symptoms discussed that would prompt ER evaluation.

## 2015-05-18 NOTE — Progress Notes (Signed)
Pre visit review using our clinic review tool, if applicable. No additional management support is needed unless otherwise documented below in the visit note. 

## 2015-05-29 DIAGNOSIS — R0789 Other chest pain: Secondary | ICD-10-CM | POA: Insufficient documentation

## 2015-05-29 NOTE — Assessment & Plan Note (Signed)
Repeat EKG today revealing sinus bradycardia. There are GERD symptoms as well as signs of mild chondritis on exam. Will begin Prilosec daily. Discussed supportive measures and OTC medications for chondritis. Will refer to Cardiology for stress test giving these chronic symptoms to verify nothing further is needed from cardiac standpoint. Alarm signs/symptoms discussed that would prompt ER evaluation.

## 2015-06-04 ENCOUNTER — Ambulatory Visit: Payer: 59 | Admitting: Physician Assistant

## 2015-06-04 ENCOUNTER — Telehealth: Payer: Self-pay | Admitting: Physician Assistant

## 2015-06-04 DIAGNOSIS — Z0289 Encounter for other administrative examinations: Secondary | ICD-10-CM

## 2015-06-05 NOTE — Telephone Encounter (Signed)
Charge. 

## 2015-06-05 NOTE — Telephone Encounter (Signed)
Pt was no show 06/04/15 1:15pm for follow up appt, pt has not rescheduled, this is 2nd no show and 2-3 cancellations w/in 12 months, charge or no charge?

## 2015-06-06 ENCOUNTER — Encounter: Payer: Self-pay | Admitting: Physician Assistant

## 2015-06-06 NOTE — Telephone Encounter (Signed)
Marked to charge and mailing no show letter °

## 2015-06-15 ENCOUNTER — Ambulatory Visit: Payer: 59 | Admitting: Internal Medicine

## 2015-06-19 ENCOUNTER — Encounter: Payer: Self-pay | Admitting: Internal Medicine

## 2015-06-21 ENCOUNTER — Encounter: Payer: Self-pay | Admitting: *Deleted

## 2015-09-02 ENCOUNTER — Emergency Department (HOSPITAL_COMMUNITY)
Admission: EM | Admit: 2015-09-02 | Discharge: 2015-09-02 | Disposition: A | Discharge status: Home / Self Care | Payer: 59 | Attending: Emergency Medicine | Admitting: Emergency Medicine

## 2015-09-02 ENCOUNTER — Emergency Department (HOSPITAL_COMMUNITY): Payer: 59

## 2015-09-02 ENCOUNTER — Encounter (HOSPITAL_COMMUNITY): Payer: Self-pay

## 2015-09-02 DIAGNOSIS — R0789 Other chest pain: Secondary | ICD-10-CM | POA: Insufficient documentation

## 2015-09-02 LAB — CBC
HCT: 44.6 % (ref 39.0–52.0)
HEMOGLOBIN: 15 g/dL (ref 13.0–17.0)
MCH: 27.7 pg (ref 26.0–34.0)
MCHC: 33.6 g/dL (ref 30.0–36.0)
MCV: 82.3 fL (ref 78.0–100.0)
Platelets: 170 10*3/uL (ref 150–400)
RBC: 5.42 MIL/uL (ref 4.22–5.81)
RDW: 12.9 % (ref 11.5–15.5)
WBC: 5.8 10*3/uL (ref 4.0–10.5)

## 2015-09-02 LAB — BASIC METABOLIC PANEL
ANION GAP: 9 (ref 5–15)
BUN: 10 mg/dL (ref 6–20)
CALCIUM: 9.6 mg/dL (ref 8.9–10.3)
CO2: 23 mmol/L (ref 22–32)
Chloride: 104 mmol/L (ref 101–111)
Creatinine, Ser: 1.12 mg/dL (ref 0.61–1.24)
Glucose, Bld: 93 mg/dL (ref 65–99)
Potassium: 3.6 mmol/L (ref 3.5–5.1)
Sodium: 136 mmol/L (ref 135–145)

## 2015-09-02 LAB — I-STAT TROPONIN, ED
TROPONIN I, POC: 0.01 ng/mL (ref 0.00–0.08)
Troponin i, poc: 0 ng/mL (ref 0.00–0.08)

## 2015-09-02 MED ORDER — RANITIDINE HCL 150 MG PO CAPS: 150.0000 mg | ORAL_CAPSULE | Freq: Every day | ORAL | Status: DC

## 2015-09-02 NOTE — ED Notes (Signed)
Per Pt, Pt started to have left-sided chest pressure starting three days ago while sleeping. Pt reports this morning, a sharp radiating pain down left arm and leg woke him out of his sleeps. Complains of a numbness feeling that subsided. Pt reports intermittent SOB, Dizziness, and Chest pain for the last two days. Pt was given 324 mg of Aspirin with EMS. Vitals per EMS: 118/84, 60 NSR, 18 RR, 100 % on RA, 95 CBG.

## 2015-09-02 NOTE — ED Provider Notes (Signed)
CSN: [id number]     Arrival date & time 09/02/15  1326 History   First MD Initiated Contact with Patient 09/02/15 1340     Chief Complaint  Patient presents with  . Chest Pain   (Consider location/radiation/quality/duration/timing/severity/associated sxs/prior Treatment) Patient is a 25 y.o. male presenting with chest pain. The history is provided by the patient.  Chest Pain Pain location:  L chest Pain quality: pressure   Pain radiates to:  L arm Pain severity:  Mild Onset quality:  Gradual Duration:  3 days Timing:  Intermittent Progression:  Waxing and waning Chronicity:  Recurrent Context: not breathing   Context comment:  When lying flat Relieved by:  None tried Associated symptoms: no abdominal pain, no back pain, no cough, no dizziness, no fever, no nausea, no palpitations, no shortness of breath and not vomiting     Past Medical History  Diagnosis Date  . Hyperlipidemia    Past Surgical History  Procedure Laterality Date  . No past surgeries     Family History  Problem Relation Age of Onset  . Hyperlipidemia Mother   . Hypertension Father    Social History  Substance Use Topics  . Smoking status: Never Smoker   . Smokeless tobacco: Never Used  . Alcohol Use: 0.0 oz/week    0 Standard drinks or equivalent per week     Comment: Occasionally    Review of Systems  Constitutional: Negative for fever and chills.  HENT: Negative for congestion and sore throat.   Eyes: Negative for pain.  Respiratory: Negative for cough and shortness of breath.   Cardiovascular: Positive for chest pain. Negative for palpitations and leg swelling.  Gastrointestinal: Negative for nausea, vomiting, abdominal pain and diarrhea.  Endocrine: Negative.   Genitourinary: Negative for flank pain.  Musculoskeletal: Negative for back pain and neck pain.  Skin: Negative for rash.  Allergic/Immunologic: Negative.   Neurological: Negative for dizziness, syncope and light-headedness.   Left arm numbness  Psychiatric/Behavioral: Negative for confusion.    Allergies  Review of patient's allergies indicates no known allergies.  Home Medications   Prior to Admission medications   Medication Sig Start Date End Date Taking? Authorizing Provider  omeprazole (PRILOSEC) 20 MG capsule Take 1 capsule (20 mg total) by mouth daily. Patient not taking: Reported on 09/02/2015 05/18/15   Waldon MerlWilliam C Martin, PA-C   BP 124/77 mmHg  Pulse 61  Temp(Src) 97.9 F (36.6 C) (Oral)  Resp 15  Ht 5\' 9"  (1.753 m)  Wt 65.772 kg  BMI 21.40 kg/m2  SpO2 100% Physical Exam  Constitutional: He is oriented to person, place, and time. He appears well-developed and well-nourished.  HENT:  Head: Normocephalic and atraumatic.  Eyes: Conjunctivae and EOM are normal. Pupils are equal, round, and reactive to light.  Neck: Normal range of motion. Neck supple.  Cardiovascular: Normal rate, regular rhythm, normal heart sounds and intact distal pulses.   Pulses:      Radial pulses are 2+ on the right side, and 2+ on the left side.  Pulmonary/Chest: Effort normal and breath sounds normal. No respiratory distress.  Abdominal: Soft. Bowel sounds are normal. There is no tenderness.  Musculoskeletal: Normal range of motion.       Left hand: Decreased sensation is not present in the ulnar distribution, is not present in the medial redistribution and is not present in the radial distribution. He exhibits no finger abduction, no thumb/finger opposition and no wrist extension trouble.  Negative spurling test  Neurological: He  is alert and oriented to person, place, and time. He has normal strength and normal reflexes. No cranial nerve deficit or sensory deficit. GCS eye subscore is 4. GCS verbal subscore is 5. GCS motor subscore is 6.  Normal finger to nose bilaterally.   No pronator drift bilaterally.    Skin: Skin is warm and dry.    ED Course  Procedures (including critical care time) Labs Review Labs  Reviewed  BASIC METABOLIC PANEL  CBC  I-STAT TROPOININ, ED  Rosezena Sensor, ED    Imaging Review Dg Chest 2 View  09/02/2015  CLINICAL DATA:  Left chest pain, dizziness EXAM: CHEST  2 VIEW COMPARISON:  05/06/2015 FINDINGS: Lungs are clear.  No pleural effusion or pneumothorax. The heart is normal in size. Visualized osseous structures are within normal limits. IMPRESSION: Normal chest radiographs. Electronically Signed   By: Charline Bills M.D.   On: 09/02/2015 14:09   I have personally reviewed and evaluated these images and lab results as part of my medical decision-making.   EKG Interpretation   Date/Time:  Sunday September 02 2015 13:27:43 EDT Ventricular Rate:  59 PR Interval:    QRS Duration: 89 QT Interval:  395 QTC Calculation: 392 R Axis:   85 Text Interpretation:  Sinus rhythm ST elev, probable normal early repol  pattern No acute changes No significant change since last tracing  Confirmed by Rhunette Croft, MD, Janey Genta [phone]) on 09/02/2015 1:40:32 PM      MDM   Final diagnoses:  None    The pt is a 25 yo male presenting to the ED for CP.  Reports similar to previous CP that worsens when lying flat, feels like burning pressure and radiates to the left arm and leg.  No exertional sx, n/v, or diaphoresis with events.  No palpitations or near syncopal events.   On exam pt HDS in NAD.  Ddx ACS, PE, dissection, stroke, radiculopathy, GERD.   CXR without acute cardiopulmonary findings.  Equal pulses with no htn and doubt dissection.  Pt Wells low risk and PERC negative and doubt PE at this time.  Neuro exam with no focal abnormalities and hand numbness transient with events and doubt central pathology.  Negative Spurling test.  ECG with ST elevation but likely early repolarization and consistent with previous ECG.  Trop and delta trop negative.  HEART score low risk and low likelihood of ACS as etiology. Low suspicion for pericarditis.  Possible GERD in setting that begins when lying  down at night and pt with previous rx for Xantac but has not taken.   Labs, ECG, and images were viewed by myself  incorporated into medical decision making.  Discussed pertinent finding with patient or caregiver prior to discharge with no further questions.  Immediate return precautions given and understood.  Medical decision making supervised by my attending Dr. Clarene Duke.  Tery Sanfilippo, MD PGY-3 Emergency Medicine   Tery Sanfilippo, MD 09/02/15 1608  Laurence Spates, MD 09/02/15 [phone]

## 2015-09-02 NOTE — ED Notes (Signed)
MD at bedside. 

## 2015-09-02 NOTE — Discharge Instructions (Signed)

## 2015-09-02 NOTE — ED Notes (Signed)
Phlebotomy at the bedside  

## 2015-09-02 NOTE — ED Notes (Signed)
Iv removed at pt request

## 2015-09-02 NOTE — ED Notes (Signed)
Pt retruns from Xray.

## 2015-09-18 ENCOUNTER — Encounter (HOSPITAL_COMMUNITY): Payer: Self-pay | Admitting: Emergency Medicine

## 2015-09-18 ENCOUNTER — Emergency Department (HOSPITAL_COMMUNITY)
Admission: EM | Admit: 2015-09-18 | Discharge: 2015-09-18 | Disposition: A | Discharge status: Home / Self Care | Payer: 59 | Attending: Emergency Medicine | Admitting: Emergency Medicine

## 2015-09-18 ENCOUNTER — Emergency Department (HOSPITAL_COMMUNITY): Payer: 59

## 2015-09-18 DIAGNOSIS — Z79899 Other long term (current) drug therapy: Secondary | ICD-10-CM | POA: Insufficient documentation

## 2015-09-18 DIAGNOSIS — R0789 Other chest pain: Secondary | ICD-10-CM | POA: Insufficient documentation

## 2015-09-18 DIAGNOSIS — E785 Hyperlipidemia, unspecified: Secondary | ICD-10-CM | POA: Insufficient documentation

## 2015-09-18 DIAGNOSIS — R0602 Shortness of breath: Secondary | ICD-10-CM | POA: Insufficient documentation

## 2015-09-18 DIAGNOSIS — Z8719 Personal history of other diseases of the digestive system: Secondary | ICD-10-CM | POA: Insufficient documentation

## 2015-09-18 HISTORY — DX: Tachycardia, unspecified: R00.0

## 2015-09-18 LAB — BASIC METABOLIC PANEL
Anion gap: 8 (ref 5–15)
BUN: 15 mg/dL (ref 6–20)
CHLORIDE: 104 mmol/L (ref 101–111)
CO2: 27 mmol/L (ref 22–32)
CREATININE: 1.22 mg/dL (ref 0.61–1.24)
Calcium: 9.6 mg/dL (ref 8.9–10.3)
GFR calc non Af Amer: >60 mL/min (ref >60)
Glucose, Bld: 94 mg/dL (ref 65–99)
POTASSIUM: 4 mmol/L (ref 3.5–5.1)
Sodium: 139 mmol/L (ref 135–145)

## 2015-09-18 LAB — CBC
HEMATOCRIT: 46.5 % (ref 39.0–52.0)
HEMOGLOBIN: 15.4 g/dL (ref 13.0–17.0)
MCH: 27.3 pg (ref 26.0–34.0)
MCHC: 33.1 g/dL (ref 30.0–36.0)
MCV: 82.3 fL (ref 78.0–100.0)
PLATELETS: 172 10*3/uL (ref 150–400)
RBC: 5.65 MIL/uL (ref 4.22–5.81)
RDW: 13 % (ref 11.5–15.5)
WBC: 6.7 10*3/uL (ref 4.0–10.5)

## 2015-09-18 LAB — I-STAT TROPONIN, ED: Troponin i, poc: 0 ng/mL (ref 0.00–0.08)

## 2015-09-18 NOTE — ED Triage Notes (Signed)
Pt states that crushing chest pain started when he woke up this morning/..approx 30 minutes ago. Radiates to both sides of chest. SOB.  No history of smoking, hypertension, or diabetes.  New onset. Patient states that same symptoms happened two weeks ago.  SOB.

## 2015-09-18 NOTE — Discharge Instructions (Signed)
There does not appear to be an emergent cause for your symptoms at this time. Your exam, labs and chest x-ray are all reassuring. Please follow-up with cardiology for reevaluation in the next 2-3 days. Return to ED for new or worsening symptoms as we discussed.

## 2015-09-18 NOTE — ED Notes (Signed)
Bed: WA02 Expected date:  Expected time:  Means of arrival:  Comments: 

## 2015-09-18 NOTE — ED Provider Notes (Signed)
[facility]-EMERGENCY DEPT Provider Note   CSN: [id number] Arrival date & time: 09/18/15  4098  First Provider Contact:  None       History   Chief Complaint Chief Complaint  Patient presents with  . Chest Pain  . Shortness of Breath    HPI Eric Cisneros is a 25 y.o. male here for evaluation of chest pain shortness of breath. Patient reports he awoke this morning at approximately 8:30 AM with a crushing, burning chest discomfort. He reports associated shortness of breath. Short of breath lasted approximately 30 minutes and resolved. He reports chest discomfort is improved from original onset, but he can still "feel a little bit". Discomfort is worse with lying flat, better with sitting up and lying forward. No diaphoresis, nausea or vomiting, dizziness or palpitations. Denies family history of cardiac disease, denies cigarette smoking. PERC negative. Patient also reports that he has had multiple life stressors and feels as if his symptoms may be secondary to stress.  HPI  Past Medical History:  Diagnosis Date  . Hyperlipidemia   . Tachycardia     Patient Active Problem List   Diagnosis Date Noted  . Atypical chest pain 05/29/2015  . Gastroesophageal reflux disease without esophagitis 10/25/2014  . Visit for preventive health examination 07/21/2014  . Immunity status testing 07/21/2014  . Routine general medical examination at a health care facility 08/01/2011  . Cerumen impaction 08/01/2011    Past Surgical History:  Procedure Laterality Date  . NO PAST SURGERIES         Home Medications    Prior to Admission medications   Medication Sig Start Date End Date Taking? Authorizing Provider  Cyanocobalamin (B-12 PO) Take 1 tablet by mouth daily.   Yes Historical Provider, MD  ferrous sulfate 325 (65 FE) MG tablet Take 325 mg by mouth daily with breakfast.   Yes Historical Provider, MD  ranitidine (ZANTAC) 150 MG capsule Take 1 capsule (150 mg total) by mouth daily. 09/02/15   Yes Tery Sanfilippo, MD  omeprazole (PRILOSEC) 20 MG capsule Take 1 capsule (20 mg total) by mouth daily. Patient not taking: Reported on 09/02/2015 05/18/15   Waldon Merl, PA-C    Family History Family History  Problem Relation Age of Onset  . Hyperlipidemia Mother   . Hypertension Father     Social History Social History  Substance Use Topics  . Smoking status: Never Smoker  . Smokeless tobacco: Never Used  . Alcohol use 0.0 oz/week     Comment: Occasionally     Allergies   Review of patient's allergies indicates no known allergies.   Review of Systems Review of Systems A 10 point review of systems was completed and was negative except for pertinent positives and negatives as mentioned in the history of present illness    Physical Exam Updated Vital Signs BP 131/91 (BP Location: Right Arm)   Pulse 63   Temp 98.4 F (36.9 C) (Oral)   Resp 18   SpO2 100%   Physical Exam  Constitutional: He appears well-developed. No distress.  Awake, alert and nontoxic in appearance  HENT:  Head: Normocephalic and atraumatic.  Right Ear: External ear normal.  Left Ear: External ear normal.  Mouth/Throat: Oropharynx is clear and moist.  Eyes: Conjunctivae and EOM are normal. Pupils are equal, round, and reactive to light.  Neck: Normal range of motion. No JVD present.  Cardiovascular: Normal rate, regular rhythm and normal heart sounds.   Pulmonary/Chest: Effort normal and breath sounds normal.  No stridor. He exhibits no tenderness.  Abdominal: Soft. There is no tenderness.  Musculoskeletal: Normal range of motion.  Neurological:  Awake, alert, cooperative and aware of situation; motor strength bilaterally; sensation normal to light touch bilaterally; no facial asymmetry; tongue midline; major cranial nerves appear intact;  baseline gait without new ataxia.  Skin: No rash noted. He is not diaphoretic.  Psychiatric: He has a normal mood and affect. His behavior is normal.  Thought content normal.  Nursing note and vitals reviewed.    ED Treatments / Results  Labs (all labs ordered are listed, but only abnormal results are displayed) Labs Reviewed  BASIC METABOLIC PANEL  CBC  I-STAT TROPOININ, ED    EKG  EKG Interpretation  Date/Time:  Tuesday September 18 2015 09:21:54 EDT Ventricular Rate:  52 PR Interval:    QRS Duration: 93 QT Interval:  404 QTC Calculation: 376 R Axis:   91 Text Interpretation:  Sinus rhythm Borderline right axis deviation LVH by voltage ST elevation suggests acute pericarditis When compared with ECG of 09/02/2015 No significant change was found Confirmed by [facility]  MD, Nicholos Johns [phone]) on 09/18/2015 2:29:57 PM       Radiology Dg Chest 2 View  Result Date: 09/18/2015 CLINICAL DATA:  New mid chest pain. EXAM: CHEST  2 VIEW COMPARISON:  Two-view chest x-ray 09/02/2015 FINDINGS: The heart size and mediastinal contours are within normal limits. Both lungs are clear. The visualized skeletal structures are unremarkable. IMPRESSION: Negative two view chest x-ray Electronically Signed   By: Marin Roberts M.D.   On: 09/18/2015 10:47   Procedures Procedures (including critical care time)  Medications Ordered in ED Medications - No data to display   Initial Impression / Assessment and Plan / ED Course  I have reviewed the triage vital signs and the nursing notes.  Pertinent labs & imaging results that were available during my care of the patient were reviewed by me and considered in my medical decision making (see chart for details).  Clinical Course    Patient with atypical chest pain similar to previous, worse with lying flat and better with leaning forward. EKG with diffuse ST elevations, but this is unchanged from previous. Chest x-ray, screening labs, troponin are all negative. Discomfort is nonexertional. Symptoms possibly due to pericarditis, will treat with NSAIDs, given cardiology follow-up. PERC negative. Discussed  strict return precautions, patient verbalizes understanding, agrees with plan and subsequent discharge.  Final Clinical Impressions(s) / ED Diagnoses   Final diagnoses:  Atypical chest pain    New Prescriptions Discharge Medication List as of 09/18/2015 12:51 PM       Joycie Peek, PA-C 09/18/15 1514    Samuel Jester, DO 09/22/15 1213

## 2015-09-25 ENCOUNTER — Emergency Department (HOSPITAL_COMMUNITY): Payer: 59

## 2015-09-25 ENCOUNTER — Encounter (HOSPITAL_COMMUNITY): Payer: Self-pay | Admitting: Emergency Medicine

## 2015-09-25 ENCOUNTER — Emergency Department (HOSPITAL_COMMUNITY)
Admission: EM | Admit: 2015-09-25 | Discharge: 2015-09-26 | Disposition: A | Discharge status: Home / Self Care | Payer: 59 | Attending: Emergency Medicine | Admitting: Emergency Medicine

## 2015-09-25 DIAGNOSIS — K219 Gastro-esophageal reflux disease without esophagitis: Secondary | ICD-10-CM | POA: Insufficient documentation

## 2015-09-25 DIAGNOSIS — Z79899 Other long term (current) drug therapy: Secondary | ICD-10-CM | POA: Insufficient documentation

## 2015-09-25 LAB — CBC
HCT: 43.1 % (ref 39.0–52.0)
HEMOGLOBIN: 14.8 g/dL (ref 13.0–17.0)
MCH: 28.1 pg (ref 26.0–34.0)
MCHC: 34.3 g/dL (ref 30.0–36.0)
MCV: 81.9 fL (ref 78.0–100.0)
PLATELETS: 183 10*3/uL (ref 150–400)
RBC: 5.26 MIL/uL (ref 4.22–5.81)
RDW: 13 % (ref 11.5–15.5)
WBC: 6.4 10*3/uL (ref 4.0–10.5)

## 2015-09-25 LAB — BASIC METABOLIC PANEL
ANION GAP: 8 (ref 5–15)
BUN: 14 mg/dL (ref 6–20)
CALCIUM: 9.4 mg/dL (ref 8.9–10.3)
CO2: 25 mmol/L (ref 22–32)
CREATININE: 1.21 mg/dL (ref 0.61–1.24)
Chloride: 106 mmol/L (ref 101–111)
GFR calc Af Amer: >60 mL/min (ref >60)
GLUCOSE: 97 mg/dL (ref 65–99)
Potassium: 3.9 mmol/L (ref 3.5–5.1)
Sodium: 139 mmol/L (ref 135–145)

## 2015-09-25 LAB — I-STAT TROPONIN, ED: TROPONIN I, POC: 0 ng/mL (ref 0.00–0.08)

## 2015-09-25 MED ORDER — KETOROLAC TROMETHAMINE 60 MG/2ML IM SOLN
60.0000 mg | Freq: Once | INTRAMUSCULAR | Status: AC
  Administered 2015-09-25: 60 mg via INTRAMUSCULAR
  Filled 2015-09-25: qty 2

## 2015-09-25 MED ORDER — GI COCKTAIL ~~LOC~~
30.0000 mL | Freq: Once | ORAL | Status: AC
  Administered 2015-09-25: 30 mL via ORAL
  Filled 2015-09-25: qty 30

## 2015-09-25 NOTE — ED Triage Notes (Signed)
Patient presents for right sided CP, SOB, dizziness, tingling in left arm and nausea starting 10 minutes prior to arrival. Describes pain as pressure. Denies emesis, no radiating pain. A&O x4.

## 2015-09-25 NOTE — ED Provider Notes (Signed)
[facility]-EMER[facility] DEPT Provider Note   CSN: [id number] Arrival date & time: 09/25/15  2133  First Provider Contact:    First MD Initiated Contact with Patient 09/25/15 2316     By signing my name below, I, Emmanuella Mensah, attest that this documentation has been prepared under the direction and in the presence of Rahshawn Remo, MD. Electronically Signed: Angelene Giovanni, ED Scribe. 09/25/15. 11:26 PM.   History   Chief Complaint Chief Complaint  Patient presents with  . Chest Pain   HPI Comments: Eric Cisneros is a 25 y.o. male with a hx of GERD who presents to the Emergency Department complaining of ongoing moderate pressure chest pain onset one hour ago. He reports associated episode of palpitations that has since resolved. He notes that he had fast food prior to onset of pain. No alleviating factors noted. He reports that he is currently on Zantac once daily for approx. 1.5 weeks now. He denies any long car/plan trips or a hx of PE/DVT. No fever, chills, or n/v/d.    The history is provided by the patient. No language interpreter was used.  Chest Pain  Description: gurgling. Severity:  Moderate Onset quality:  Gradual Timing:  Constant Progression:  Unchanged Chronicity:  Recurrent Context comment:  Eating fastfood Relieved by:  Nothing Worsened by:  Nothing Ineffective treatments:  None tried Associated symptoms: chest pain and palpitations   Associated symptoms: no diarrhea, no nausea and no vomiting   Chest pain:    Quality: pressure     Severity:  Moderate   Onset quality:  Gradual   Duration:  1 hour   Timing:  Constant   Progression:  Worsening   Chronicity:  Recurrent   Past Medical History:  Diagnosis Date  . Hyperlipidemia   . Tachycardia     Patient Active Problem List   Diagnosis Date Noted  . Atypical chest pain 05/29/2015  . Gastroesophageal reflux disease without esophagitis 10/25/2014  . Visit for preventive health examination 07/21/2014  .  Immunity status testing 07/21/2014  . Routine general medical examination at a health care facility 08/01/2011  . Cerumen impaction 08/01/2011    Past Surgical History:  Procedure Laterality Date  . NO PAST SURGERIES         Home Medications    Prior to Admission medications   Medication Sig Start Date End Date Taking? Authorizing Provider  Cyanocobalamin (B-12 PO) Take 1 tablet by mouth daily.   Yes Historical Provider, MD  ferrous sulfate 325 (65 FE) MG tablet Take 325 mg by mouth daily with breakfast.   Yes Historical Provider, MD  ranitidine (ZANTAC) 150 MG capsule Take 1 capsule (150 mg total) by mouth daily. 09/02/15  Yes Tery Sanfilippo, MD  simethicone (MYLICON) 80 MG chewable tablet Chew 80 mg by mouth every 6 (six) hours as needed for flatulence.   Yes Historical Provider, MD  omeprazole (PRILOSEC) 20 MG capsule Take 1 capsule (20 mg total) by mouth daily. Patient not taking: Reported on 09/02/2015 05/18/15   Waldon Merl, PA-C    Family History Family History  Problem Relation Age of Onset  . Hyperlipidemia Mother   . Hypertension Father     Social History Social History  Substance Use Topics  . Smoking status: Never Smoker  . Smokeless tobacco: Never Used  . Alcohol use 0.0 oz/week     Comment: Occasionally     Allergies   Review of patient's allergies indicates no known allergies.   Review of Systems Review  of Systems  Constitutional: Negative for chills and fever.  Cardiovascular: Positive for chest pain and palpitations.  Gastrointestinal: Negative for abdominal pain, diarrhea, nausea and vomiting.  All other systems reviewed and are negative.    Physical Exam Updated Vital Signs BP 111/77 (BP Location: Right Arm)   Pulse 72   Temp 98.2 F (36.8 C) (Oral)   Resp 15   SpO2 100%   Physical Exam  Constitutional: He is oriented to person, place, and time. He appears well-developed and well-nourished. No distress.  HENT:  Head: Normocephalic  and atraumatic.  Mouth/Throat: Oropharynx is clear and moist.  Eyes: Conjunctivae and EOM are normal. Pupils are equal, round, and reactive to light.  Neck: Normal range of motion. Neck supple. No tracheal deviation present.  No bruits  Cardiovascular: Normal rate, regular rhythm, normal heart sounds and intact distal pulses.   Pulmonary/Chest: Effort normal. No stridor. No respiratory distress.  Abdominal: Soft. Bowel sounds are normal. He exhibits no mass. There is no tenderness. There is no rebound and no guarding. No hernia.  Gas in the epigastrium  Mild constipation throught decensing and transverse colon  Musculoskeletal: Normal range of motion.  All compartments soft Intact distal pulses  Neurological: He is alert and oriented to person, place, and time.  Skin: Skin is warm and dry. Capillary refill takes less than 2 seconds.  Psychiatric: He has a normal mood and affect. His behavior is normal.  Nursing note and vitals reviewed.    ED Treatments / Results  DIAGNOSTIC STUDIES: Oxygen Saturation is 100% on RA, normal by my interpretation.    COORDINATION OF CARE: 11:19 PM- Pt advised of plan for treatment and pt agrees. Pt informed of his lab results. Advised to increase Zantac to twice daily. Recommended to adopt a bland diet while on the Zantac.    Labs (all labs ordered are listed, but only abnormal results are displayed) Labs Reviewed  BASIC METABOLIC PANEL  CBC  I-STAT TROPOININ, ED    EKG  EKG Interpretation  Date/Time:  Tuesday September 25 2015 21:40:48 EDT Ventricular Rate:  72 PR Interval:    QRS Duration: 88 QT Interval:  376 QTC Calculation: 412 R Axis:   87 Text Interpretation:  Sinus rhythm LVH by voltage Inferior infarct, acute (LCx) ST elevation, consider anterior injury Lateral leads are also involved since last tracing no significant change Confirmed by [facility]  MD, MELANIE ([id number]) on 09/25/2015 9:45:02 PM       Radiology Dg Chest 2 View  Result  Date: 09/25/2015 CLINICAL DATA:  Sudden onset of mid chest pain this evening EXAM: CHEST  2 VIEW COMPARISON:  09/18/2015 FINDINGS: Normal heart size, mediastinal contours, and pulmonary vascularity. Lungs clear. No pleural effusion or pneumothorax. Bones unremarkable. IMPRESSION: Normal exam. Electronically Signed   By: Ulyses Southward M.D.   On: 09/25/2015 21:58   Procedures Procedures (including critical care time)  Medications Ordered in ED Medications - No data to display   Initial Impression / Assessment and Plan / ED Course  Shaquna Geigle, MD has reviewed the triage vital signs and the nursing notes.  Pertinent labs & imaging results that were available during my care of the patient were reviewed by me and considered in my medical decision making (see chart for details).  Clinical Course   Vitals:   09/25/15 2140  BP: 111/77  Pulse: 72  Resp: 15  Temp: 98.2 F (36.8 C)    Results for orders placed or performed during the hospital  encounter of 09/25/15  Basic metabolic panel  Result Value Ref Range   Sodium 139 135 - 145 mmol/L   Potassium 3.9 3.5 - 5.1 mmol/L   Chloride 106 101 - 111 mmol/L   CO2 25 22 - 32 mmol/L   Glucose, Bld 97 65 - 99 mg/dL   BUN 14 6 - 20 mg/dL   Creatinine, Ser 1.61 0.61 - 1.24 mg/dL   Calcium 9.4 8.9 - 09.6 mg/dL   GFR calc non Af Amer >60 >60 mL/min   GFR calc Af Amer >60 >60 mL/min   Anion gap 8 5 - 15  CBC  Result Value Ref Range   WBC 6.4 4.0 - 10.5 K/uL   RBC 5.26 4.22 - 5.81 MIL/uL   Hemoglobin 14.8 13.0 - 17.0 g/dL   HCT 04.5 40.9 - 81.1 %   MCV 81.9 78.0 - 100.0 fL   MCH 28.1 26.0 - 34.0 pg   MCHC 34.3 30.0 - 36.0 g/dL   RDW 91.4 78.2 - 95.6 %   Platelets 183 150 - 400 K/uL  I-stat troponin, ED  Result Value Ref Range   Troponin i, poc 0.00 0.00 - 0.08 ng/mL   Comment 3           Dg Chest 2 View  Result Date: 09/25/2015 CLINICAL DATA:  Sudden onset of mid chest pain this evening EXAM: CHEST  2 VIEW COMPARISON:  09/18/2015  FINDINGS: Normal heart size, mediastinal contours, and pulmonary vascularity. Lungs clear. No pleural effusion or pneumothorax. Bones unremarkable. IMPRESSION: Normal exam. Electronically Signed   By: Ulyses Southward M.D.   On: 09/25/2015 21:58   Dg Chest 2 View  Result Date: 09/18/2015 CLINICAL DATA:  New mid chest pain. EXAM: CHEST  2 VIEW COMPARISON:  Two-view chest x-ray 09/02/2015 FINDINGS: The heart size and mediastinal contours are within normal limits. Both lungs are clear. The visualized skeletal structures are unremarkable. IMPRESSION: Negative two view chest x-ray Electronically Signed   By: Marin Roberts M.D.   On: 09/18/2015 10:47  Dg Chest 2 View  Result Date: 09/02/2015 CLINICAL DATA:  Left chest pain, dizziness EXAM: CHEST  2 VIEW COMPARISON:  05/06/2015 FINDINGS: Lungs are clear.  No pleural effusion or pneumothorax. The heart is normal in size. Visualized osseous structures are within normal limits. IMPRESSION: Normal chest radiographs. Electronically Signed   By: Charline Bills M.D.   On: 09/02/2015 14:09     Final Clinical Impressions(s) / ED Diagnoses   Final diagnoses:  None    PERC negative wells 0 highly doubt PE.  Has had this pain and been seen for this multiple times in 8 months always with eating.  He is taking a subpar dose of zantac and is not adhering to huis GERD friendly diet. He is very thin and the EKG is consistent with Benign early repol.  . The recorded information has been reviewed and is accurate. All questions answered to patient's satisfaction. Based on history and exam patient has been appropriately medically screened and emergency conditions excluded. Patient is stable for discharge at this time. Follow up with your PMDfor recheck in 2 daysand strict return precautions given.    New Prescriptions New Prescriptions   No medications on file  I personally performed the services described in this documentation, which was scribed in my presence    Maddux Vanscyoc, MD 09/25/15 2351

## 2016-01-02 ENCOUNTER — Emergency Department (HOSPITAL_COMMUNITY): Payer: 59

## 2016-01-02 ENCOUNTER — Encounter (HOSPITAL_COMMUNITY): Payer: Self-pay

## 2016-01-02 ENCOUNTER — Emergency Department (HOSPITAL_COMMUNITY)
Admission: EM | Admit: 2016-01-02 | Discharge: 2016-01-02 | Disposition: A | Discharge status: Home / Self Care | Payer: 59 | Attending: Emergency Medicine | Admitting: Emergency Medicine

## 2016-01-02 DIAGNOSIS — R0789 Other chest pain: Secondary | ICD-10-CM | POA: Insufficient documentation

## 2016-01-02 DIAGNOSIS — R079 Chest pain, unspecified: Secondary | ICD-10-CM

## 2016-01-02 HISTORY — DX: Gastro-esophageal reflux disease without esophagitis: K21.9

## 2016-01-02 LAB — BASIC METABOLIC PANEL
ANION GAP: 11 (ref 5–15)
BUN: 18 mg/dL (ref 6–20)
CALCIUM: 10 mg/dL (ref 8.9–10.3)
CO2: 25 mmol/L (ref 22–32)
Chloride: 102 mmol/L (ref 101–111)
Creatinine, Ser: 1.39 mg/dL — ABNORMAL HIGH (ref 0.61–1.24)
Glucose, Bld: 107 mg/dL — ABNORMAL HIGH (ref 65–99)
Potassium: 3.9 mmol/L (ref 3.5–5.1)
Sodium: 138 mmol/L (ref 135–145)

## 2016-01-02 LAB — CBC
HCT: 45.9 % (ref 39.0–52.0)
HEMOGLOBIN: 15.7 g/dL (ref 13.0–17.0)
MCH: 28.1 pg (ref 26.0–34.0)
MCHC: 34.2 g/dL (ref 30.0–36.0)
MCV: 82.3 fL (ref 78.0–100.0)
Platelets: 182 10*3/uL (ref 150–400)
RBC: 5.58 MIL/uL (ref 4.22–5.81)
RDW: 13.2 % (ref 11.5–15.5)
WBC: 4.7 10*3/uL (ref 4.0–10.5)

## 2016-01-02 LAB — I-STAT TROPONIN, ED: TROPONIN I, POC: 0 ng/mL (ref 0.00–0.08)

## 2016-01-02 NOTE — ED Provider Notes (Signed)
[facility]-EMERGENCY DEPT Provider Note   CSN: [id number] Arrival date & time: 01/02/16  [id number]     History   Chief Complaint Chief Complaint  Patient presents with  . Chest Pain  . Headache    HPI Eric Cisneros is a 25 y.o. male.  HPI Patient presents to the emergency department with complaints of anterior chest discomfort that lasts approximately 30 minutes.  He states that at worst it was 7 out of 10.  It was worse with movement and some deep breathing.  No history of PE or DVT.  No family history of early cardiac disease.  He does have a history of gastroesophageal reflux disease and is no longer on his PPI.  He also reported mild right-sided neck pain and headache this morning.  He denied all shortness of breath to me.  He took Tylenol today and it improved his symptoms somewhat.  He has not tried Maalox or Tums today.   Past Medical History:  Diagnosis Date  . GERD (gastroesophageal reflux disease)   . Hyperlipidemia   . Tachycardia     Patient Active Problem List   Diagnosis Date Noted  . Atypical chest pain 05/29/2015  . Gastroesophageal reflux disease without esophagitis 10/25/2014  . Visit for preventive health examination 07/21/2014  . Immunity status testing 07/21/2014  . Routine general medical examination at a health care facility 08/01/2011  . Cerumen impaction 08/01/2011    Past Surgical History:  Procedure Laterality Date  . NO PAST SURGERIES         Home Medications    Prior to Admission medications   Medication Sig Start Date End Date Taking? Authorizing Provider  acetaminophen (TYLENOL) 500 MG tablet Take 500 mg by mouth every 6 (six) hours as needed for mild pain.   Yes Historical Provider, MD  Cyanocobalamin (B-12 PO) Take 1 tablet by mouth daily.   Yes Historical Provider, MD  ferrous sulfate 325 (65 FE) MG tablet Take 325 mg by mouth daily with breakfast.   Yes Historical Provider, MD    Family History Family History  Problem Relation Age  of Onset  . Hyperlipidemia Mother   . Hypertension Father     Social History Social History  Substance Use Topics  . Smoking status: Never Smoker  . Smokeless tobacco: Never Used  . Alcohol use 0.0 oz/week     Comment: Occasionally     Allergies   Patient has no known allergies.   Review of Systems Review of Systems  All other systems reviewed and are negative.    Physical Exam Updated Vital Signs BP 116/90   Pulse (!) 57   Temp 97.9 F (36.6 C) (Oral)   Resp 25   Ht 5\' 9"  (1.753 m)   Wt 145 lb (65.8 kg)   SpO2 100%   BMI 21.41 kg/m   Physical Exam  Constitutional: He is oriented to person, place, and time. He appears well-developed and well-nourished.  HENT:  Head: Normocephalic and atraumatic.  Eyes: EOM are normal.  Neck: Normal range of motion.  Cardiovascular: Normal rate, regular rhythm, normal heart sounds and intact distal pulses.   Pulmonary/Chest: Effort normal and breath sounds normal. No respiratory distress.  Abdominal: Soft. He exhibits no distension. There is no tenderness.  Musculoskeletal: Normal range of motion.  Neurological: He is alert and oriented to person, place, and time.  Skin: Skin is warm and dry.  Psychiatric: He has a normal mood and affect. Judgment normal.  Nursing note and vitals  reviewed.    ED Treatments / Results  Labs (all labs ordered are listed, but only abnormal results are displayed) Labs Reviewed  BASIC METABOLIC PANEL - Abnormal; Notable for the following:       Result Value   Glucose, Bld 107 (*)    Creatinine, Ser 1.39 (*)    All other components within normal limits  CBC  I-STAT TROPOININ, ED    EKG  EKG Interpretation  Date/Time:  Wednesday January 02 2016 07:39:52 EST Ventricular Rate:  85 PR Interval:    QRS Duration: 89 QT Interval:  353 QTC Calculation: 420 R Axis:   94 Text Interpretation:  Sinus rhythm Benign early repolarization No significant change since last tracing Confirmed by  [facility] LP[facility] MD, Denny PeonERIN ([id number]) on 01/02/2016 7:44:19 AM Also confirmed by [facility], ERIN ([id number]), editor [place] CT, Jola BabinskiMarilyn [phone]  on 01/02/2016 7:55:21 AM       Radiology Dg Chest 2 View  Result Date: 01/02/2016 CLINICAL DATA:  Mid chest pain and shortness of breath beginning this morning. EXAM: CHEST  2 VIEW COMPARISON:  PA and lateral chest 09/25/2015 and 04/14/2015. FINDINGS: The lungs are clear. Heart size is normal. No pneumothorax or pleural effusion. No bony abnormality. IMPRESSION: Negative chest. Electronically Signed   By: Drusilla Kannerhomas  Dalessio M.D.   On: 01/02/2016 08:36    Procedures Procedures (including critical care time)  Medications Ordered in ED Medications - No data to display   Initial Impression / Assessment and Plan / ED Course  I have reviewed the triage vital signs and the nursing notes.  Pertinent labs & imaging results that were available during my care of the patient were reviewed by me and considered in my medical decision making (see chart for details).  Clinical Course     Overall the patient is well-appearing.  I suspect this is gastroesophageal reflux disease.  Doubt ACS.  Doubt PE.  EKG and chest x-ray and labs without significant abnormality.  Primary care follow-up.  Patient placed on a PPI.  Final Clinical Impressions(s) / ED Diagnoses   Final diagnoses:  Chest pain, unspecified type    New Prescriptions Discharge Medication List as of 01/02/2016  9:29 AM       Azalia BilisKevin Jarvis Sawa, MD 01/02/16 [phone]

## 2016-01-02 NOTE — Discharge Instructions (Signed)
Take Generic Prilosec daily for 30 days  Tums or Maalox as needed

## 2016-01-02 NOTE — ED Notes (Signed)
Patient transported to X-ray 

## 2016-01-02 NOTE — ED Triage Notes (Signed)
Pt c/o central chest pain, "a little" SOB, R posterior neck pain and headache starting this morning.  Pain score 7/10 increasing w/ deep breathing.  Pt reports taking tylenol x 2 tablets w/ some relief.  Hx of GERD and atypical chest pain.  Pt reports that this is different from previous chest pains.

## 2016-05-29 ENCOUNTER — Emergency Department (HOSPITAL_COMMUNITY)
Admission: EM | Admit: 2016-05-29 | Discharge: 2016-05-30 | Disposition: A | Discharge status: Home / Self Care | Payer: Self-pay | Attending: Emergency Medicine | Admitting: Emergency Medicine

## 2016-05-29 ENCOUNTER — Encounter (HOSPITAL_COMMUNITY): Payer: Self-pay

## 2016-05-29 ENCOUNTER — Emergency Department (HOSPITAL_COMMUNITY): Payer: Self-pay

## 2016-05-29 DIAGNOSIS — R072 Precordial pain: Secondary | ICD-10-CM | POA: Insufficient documentation

## 2016-05-29 DIAGNOSIS — Z79899 Other long term (current) drug therapy: Secondary | ICD-10-CM | POA: Insufficient documentation

## 2016-05-29 LAB — BASIC METABOLIC PANEL
ANION GAP: 8 (ref 5–15)
BUN: 11 mg/dL (ref 6–20)
CALCIUM: 9.4 mg/dL (ref 8.9–10.3)
CO2: 26 mmol/L (ref 22–32)
CREATININE: 1.21 mg/dL (ref 0.61–1.24)
Chloride: 103 mmol/L (ref 101–111)
Glucose, Bld: 83 mg/dL (ref 65–99)
Potassium: 4.2 mmol/L (ref 3.5–5.1)
SODIUM: 137 mmol/L (ref 135–145)

## 2016-05-29 LAB — CBC
HCT: 42.7 % (ref 39.0–52.0)
HEMOGLOBIN: 14.2 g/dL (ref 13.0–17.0)
MCH: 27.5 pg (ref 26.0–34.0)
MCHC: 33.3 g/dL (ref 30.0–36.0)
MCV: 82.8 fL (ref 78.0–100.0)
PLATELETS: 205 10*3/uL (ref 150–400)
RBC: 5.16 MIL/uL (ref 4.22–5.81)
RDW: 12.8 % (ref 11.5–15.5)
WBC: 7.3 10*3/uL (ref 4.0–10.5)

## 2016-05-29 LAB — I-STAT TROPONIN, ED: TROPONIN I, POC: 0 ng/mL (ref 0.00–0.08)

## 2016-05-29 NOTE — ED Triage Notes (Signed)
Pt repots central chest pain sharp in nature that began after eating [employer]. Hx of GERD

## 2016-05-30 MED ORDER — GI COCKTAIL ~~LOC~~
30.0000 mL | Freq: Once | ORAL | Status: AC
  Administered 2016-05-30: 30 mL via ORAL
  Filled 2016-05-30: qty 30

## 2016-05-30 MED ORDER — OMEPRAZOLE 20 MG PO CPDR: 20.0000 mg | DELAYED_RELEASE_CAPSULE | Freq: Every day | ORAL | 0 refills | Status: DC

## 2016-05-30 NOTE — ED Provider Notes (Signed)
[facility]-EMERGENCY DEPT Provider Note   CSN: [id number] Arrival date & time: 05/29/16  2024     History   Chief Complaint Chief Complaint  Patient presents with  . Chest Pain    HPI Eric Cisneros is a 26 y.o. male with a hx of GERD, hyperlipidemia, tachycardia presents to the Emergency Department complaining of gradual, persistent, now resolved central chest pain described as sharp and burning onset around 8 PM soon after eating [employer]. He reports a history of reflux with pain similar to tonight. He states he is not currently taking his reflux medication due to having run out of his prescription. He denies nausea, diaphoresis, weakness, numbness, syncope.  No aggravating or alleviating factors. No treatments prior to arrival.    Record review shows that patient has had several workups in the last 6 months for similar symptoms all with reassuring findings. Patient has no personal cardiac history. He has no family history of early cardiac death.   The history is provided by the patient and medical records. No language interpreter was used.    Past Medical History:  Diagnosis Date  . GERD (gastroesophageal reflux disease)   . Hyperlipidemia   . Tachycardia     Patient Active Problem List   Diagnosis Date Noted  . Atypical chest pain 05/29/2015  . Gastroesophageal reflux disease without esophagitis 10/25/2014  . Visit for preventive health examination 07/21/2014  . Immunity status testing 07/21/2014  . Routine general medical examination at a health care facility 08/01/2011  . Cerumen impaction 08/01/2011    Past Surgical History:  Procedure Laterality Date  . NO PAST SURGERIES         Home Medications    Prior to Admission medications   Medication Sig Start Date End Date Taking? Authorizing Provider  acetaminophen (TYLENOL) 500 MG tablet Take 500 mg by mouth every 6 (six) hours as needed for mild pain.    Historical Provider, MD  Cyanocobalamin (B-12 PO) Take 1  tablet by mouth daily.    Historical Provider, MD  ferrous sulfate 325 (65 FE) MG tablet Take 325 mg by mouth daily with breakfast.    Historical Provider, MD  omeprazole (PRILOSEC) 20 MG capsule Take 1 capsule (20 mg total) by mouth daily. 05/30/16   Dahlia Client Raynesha Tiedt, PA-C    Family History Family History  Problem Relation Age of Onset  . Hyperlipidemia Mother   . Hypertension Father     Social History Social History  Substance Use Topics  . Smoking status: Never Smoker  . Smokeless tobacco: Never Used  . Alcohol use 0.0 oz/week     Comment: Occasionally     Allergies   Patient has no known allergies.   Review of Systems Review of Systems  Cardiovascular: Positive for chest pain.  All other systems reviewed and are negative.    Physical Exam Updated Vital Signs BP 111/86   Pulse 84   Temp 98.2 F (36.8 C) (Oral)   Resp 19   Ht  (1.753 m)   Wt 65.8 kg   SpO2 100%   BMI 21.41 kg/m   Physical Exam  Constitutional: He appears well-developed and well-nourished. No distress.  Awake, alert, nontoxic appearance  HENT:  Head: Normocephalic and atraumatic.  Mouth/Throat: Oropharynx is clear and moist. No oropharyngeal exudate.  Eyes: Conjunctivae are normal. No scleral icterus.  Neck: Normal range of motion. Neck supple.  Cardiovascular: Normal rate, regular rhythm and intact distal pulses.   Pulmonary/Chest: Effort normal and breath sounds  normal. No respiratory distress. He has no wheezes.  Equal chest expansion  Abdominal: Soft. Bowel sounds are normal. He exhibits no mass. There is no tenderness. There is no rebound and no guarding.  Musculoskeletal: Normal range of motion. He exhibits no edema.  Neurological: He is alert.  Speech is clear and goal oriented Moves extremities without ataxia  Skin: Skin is warm and dry. He is not diaphoretic.  Psychiatric: He has a normal mood and affect.  Nursing note and vitals reviewed.    ED Treatments / Results    Labs (all labs ordered are listed, but only abnormal results are displayed) Labs Reviewed  BASIC METABOLIC PANEL  CBC  I-STAT TROPOININ, ED    EKG  EKG Interpretation  Date/Time:  Thursday May 29 2016 21:12:20 EDT Ventricular Rate:  62 PR Interval:  152 QRS Duration: 88 QT Interval:  378 QTC Calculation: 383 R Axis:   90 Text Interpretation:  Normal sinus rhythm Rightward axis Early repolarization Borderline ECG Confirmed by Lincoln Brigham [phone]) on 05/29/2016 9:22:48 PM       Radiology Dg Chest 2 View  Result Date: 05/29/2016 CLINICAL DATA:  Chest pain EXAM: CHEST  2 VIEW COMPARISON:  01/02/2016 FINDINGS: The heart size and mediastinal contours are within normal limits. Both lungs are clear. The visualized skeletal structures are unremarkable. IMPRESSION: No active cardiopulmonary disease. Electronically Signed   By: Jasmine Pang M.D.   On: 05/29/2016 21:58    Procedures Procedures (including critical care time)  Medications Ordered in ED Medications  gi cocktail (Maalox,Lidocaine,Donnatal) (30 mLs Oral Given 05/30/16 0024)     Initial Impression / Assessment and Plan / ED Course  I have reviewed the triage vital signs and the nursing notes.  Pertinent labs & imaging results that were available during my care of the patient were reviewed by me and considered in my medical decision making (see chart for details).     Patient is to be discharged with recommendation to follow up with PCP in regards to today's hospital visit. Chest pain is not likely of cardiac or pulmonary etiology d/t presentation, PERC negative, VSS, no tracheal deviation, no JVD or new murmur, RRR, breath sounds equal bilaterally, EKG without acute abnormalities, negative troponin, and negative CXR. Resolution after GI cocktail. Numerous previous workups without concerning findings. EKG with early removal but unchanged from prior. Pt has been advised to return to the ED if CP becomes exertional, associated  with diaphoresis or nausea, radiates to left jaw/arm, worsens or becomes concerning in any way. Pt appears reliable for follow up and is agreeable to discharge.     Final Clinical Impressions(s) / ED Diagnoses   Final diagnoses:  Precordial pain    New Prescriptions Discharge Medication List as of 05/30/2016  1:01 AM    START taking these medications   Details  omeprazole (PRILOSEC) 20 MG capsule Take 1 capsule (20 mg total) by mouth daily., Starting Fri 05/30/2016, Print         Tonja Jezewski, PA-C 05/30/16 0111    Tilden Fossa, MD 06/05/16 [phone]

## 2016-05-30 NOTE — Discharge Instructions (Signed)
1. Medications: omeprazole, usual home medications 2. Treatment: rest, drink plenty of fluids,  3. Follow Up: Please followup with your primary doctor in 3-5 days for discussion of your diagnoses and further evaluation after today's visit; if you do not have a primary care doctor use the resource guide provided to find one; Please return to the ER for worsening pain, persistent vomiting, high fevers, passing out or other concerns

## 2017-06-17 ENCOUNTER — Encounter (HOSPITAL_BASED_OUTPATIENT_CLINIC_OR_DEPARTMENT_OTHER): Payer: Self-pay | Admitting: *Deleted

## 2017-06-17 ENCOUNTER — Other Ambulatory Visit: Payer: Self-pay

## 2017-06-17 ENCOUNTER — Emergency Department (HOSPITAL_BASED_OUTPATIENT_CLINIC_OR_DEPARTMENT_OTHER)
Admission: EM | Admit: 2017-06-17 | Discharge: 2017-06-17 | Disposition: A | Discharge status: Home / Self Care | Payer: 59 | Attending: Physician Assistant | Admitting: Physician Assistant

## 2017-06-17 DIAGNOSIS — R110 Nausea: Secondary | ICD-10-CM | POA: Insufficient documentation

## 2017-06-17 DIAGNOSIS — G4489 Other headache syndrome: Secondary | ICD-10-CM | POA: Insufficient documentation

## 2017-06-17 DIAGNOSIS — Z0279 Encounter for issue of other medical certificate: Secondary | ICD-10-CM | POA: Insufficient documentation

## 2017-06-17 DIAGNOSIS — R42 Dizziness and giddiness: Secondary | ICD-10-CM | POA: Insufficient documentation

## 2017-06-17 DIAGNOSIS — Z79899 Other long term (current) drug therapy: Secondary | ICD-10-CM | POA: Insufficient documentation

## 2017-06-17 NOTE — ED Notes (Signed)
ED Provider at bedside. 

## 2017-06-17 NOTE — ED Provider Notes (Signed)
[facility] EMERGENCY DEPARTMENT Provider Note   CSN: [id number] Arrival date & time: 06/17/17  1244     History   Chief Complaint Chief Complaint  Patient presents with  . Headache    HPI Lavon Borgerding is a 27 y.o. male.  HPI   Patient is a 27 year old male past medical history significant for GERD and hyperlipidemia.  He is presenting today with headache.  Patient reports that he started a new job 2 days ago and he feels like the high heat has given him a headache.  He reports it comes and goes.  He says when it there he feels mildly nauseated and occasional dizziness.  Patient has had headaches in the past but this feels different.  Patient notes that it was not maximum intensity at onset but rather has been insidious and up and down.  Patient's boss wanted him to come here and get a work note.  Patient has noted no neurologic changes.  Past Medical History:  Diagnosis Date  . GERD (gastroesophageal reflux disease)   . Hyperlipidemia   . Tachycardia     Patient Active Problem List   Diagnosis Date Noted  . Atypical chest pain 05/29/2015  . Gastroesophageal reflux disease without esophagitis 10/25/2014  . Visit for preventive health examination 07/21/2014  . Immunity status testing 07/21/2014  . Routine general medical examination at a health care facility 08/01/2011  . Cerumen impaction 08/01/2011    Past Surgical History:  Procedure Laterality Date  . NO PAST SURGERIES          Home Medications    Prior to Admission medications   Medication Sig Start Date End Date Taking? Authorizing Provider  acetaminophen (TYLENOL) 500 MG tablet Take 500 mg by mouth every 6 (six) hours as needed for mild pain.    [provider]  Cyanocobalamin (B-12 PO) Take 1 tablet by mouth daily.    [provider]  ferrous sulfate 325 (65 FE) MG tablet Take 325 mg by mouth daily with breakfast.    [provider]  omeprazole (PRILOSEC) 20 MG  capsule Take 1 capsule (20 mg total) by mouth daily. 05/30/16   Muthersbaugh, Dahlia Client, PA-C    Family History Family History  Problem Relation Age of Onset  . Hyperlipidemia Mother   . Hypertension Father     Social History Social History   Tobacco Use  . Smoking status: Never Smoker  . Smokeless tobacco: Never Used  Substance Use Topics  . Alcohol use: Yes    Alcohol/week: 0.0 oz    Comment: Occasionally  . Drug use: No     Allergies   Patient has no known allergies.   Review of Systems Review of Systems  Constitutional: Negative for activity change.  Respiratory: Negative for shortness of breath.   Cardiovascular: Negative for chest pain.  Gastrointestinal: Negative for abdominal pain.  Neurological: Positive for light-headedness and headaches.  All other systems reviewed and are negative.    Physical Exam Updated Vital Signs BP 120/74   Pulse (!) 55   Temp 98.3 F (36.8 C) (Oral)   Resp 18   Ht 5\' 9"  (1.753 m)   Wt 65.8 kg (145 lb)   SpO2 100%   BMI 21.41 kg/m   Physical Exam  Constitutional: He is oriented to person, place, and time. He appears well-nourished.  HENT:  Head: Normocephalic.  Eyes: Pupils are equal, round, and reactive to light. Conjunctivae and EOM are normal. Right eye exhibits normal extraocular  motion and no nystagmus. Left eye exhibits normal extraocular motion and no nystagmus. Pupils are equal.  Neck: Normal range of motion.  Cardiovascular: Normal rate.  Pulmonary/Chest: Effort normal.  Abdominal: Soft.  Neurological: He is alert and oriented to person, place, and time. He has normal strength. No cranial nerve deficit.  Skin: Skin is warm and dry. He is not diaphoretic.  Psychiatric: He has a normal mood and affect. His behavior is normal.     ED Treatments / Results  Labs (all labs ordered are listed, but only abnormal results are displayed) Labs Reviewed - No data to display  EKG None  Radiology No results  found.  Procedures Procedures (including critical care time)  Medications Ordered in ED Medications - No data to display   Initial Impression / Assessment and Plan / ED Course  I have reviewed the triage vital signs and the nursing notes.  Pertinent labs & imaging results that were available during my care of the patient were reviewed by me and considered in my medical decision making (see chart for details).    Patient is a 27 year old male past medical history significant for GERD and hyperlipidemia.  He is presenting today with headache.  Patient reports that he started a new job 2 days ago and he feels like the high heat has given him a headache.  He reports it comes and goes.  He says when it there he feels mildly nauseated and occasional dizziness.  Patient has had headaches in the past but this feels different.  Patient notes that it was not maximum intensity at onset but rather has been insidious and up and down.  Patient's boss wanted him to come here and get a work note.  Patient has noted no neurologic changes.  1:57 PM I discussed the patient that I was concerned about his headache given that it is new.  Offered for CAT scan and LP and further workup.  However patient does not want further workup at this point.  He feels like this is just a low-grade headache due to heat and he would like to return home with a work note.  I think this is reasonable first step, let him know that there is serious pathology that he would be risk missing if he goes without workup.  Patient is understanding and will return with any increased symptoms.  Final Clinical Impressions(s) / ED Diagnoses   Final diagnoses:  None    ED Discharge Orders    None       Mercy Malena, Cindee Saltourteney Lyn, MD 06/17/17 1357

## 2017-06-17 NOTE — ED Triage Notes (Signed)
Pt c/o h/a x 2 days after starting new job

## 2017-06-17 NOTE — Discharge Instructions (Addendum)
You were seen in the emergency department for headache.  At this time you did not wish further workup such as CAT scan or lumbar puncture.  However if you change your mind you are welcome back at any time.  Recommend use ibuprofen or Tylenol, drink plenty fluids and stay hydrated.

## 2017-06-27 IMAGING — CR DG CHEST 2V
2 series · 2 of 2 positions shown · non-contrast
Comparison: 09/18/2015

CLINICAL DATA: Sudden onset of mid chest pain this evening

EXAM:
CHEST  2 VIEW

[w chest pa]
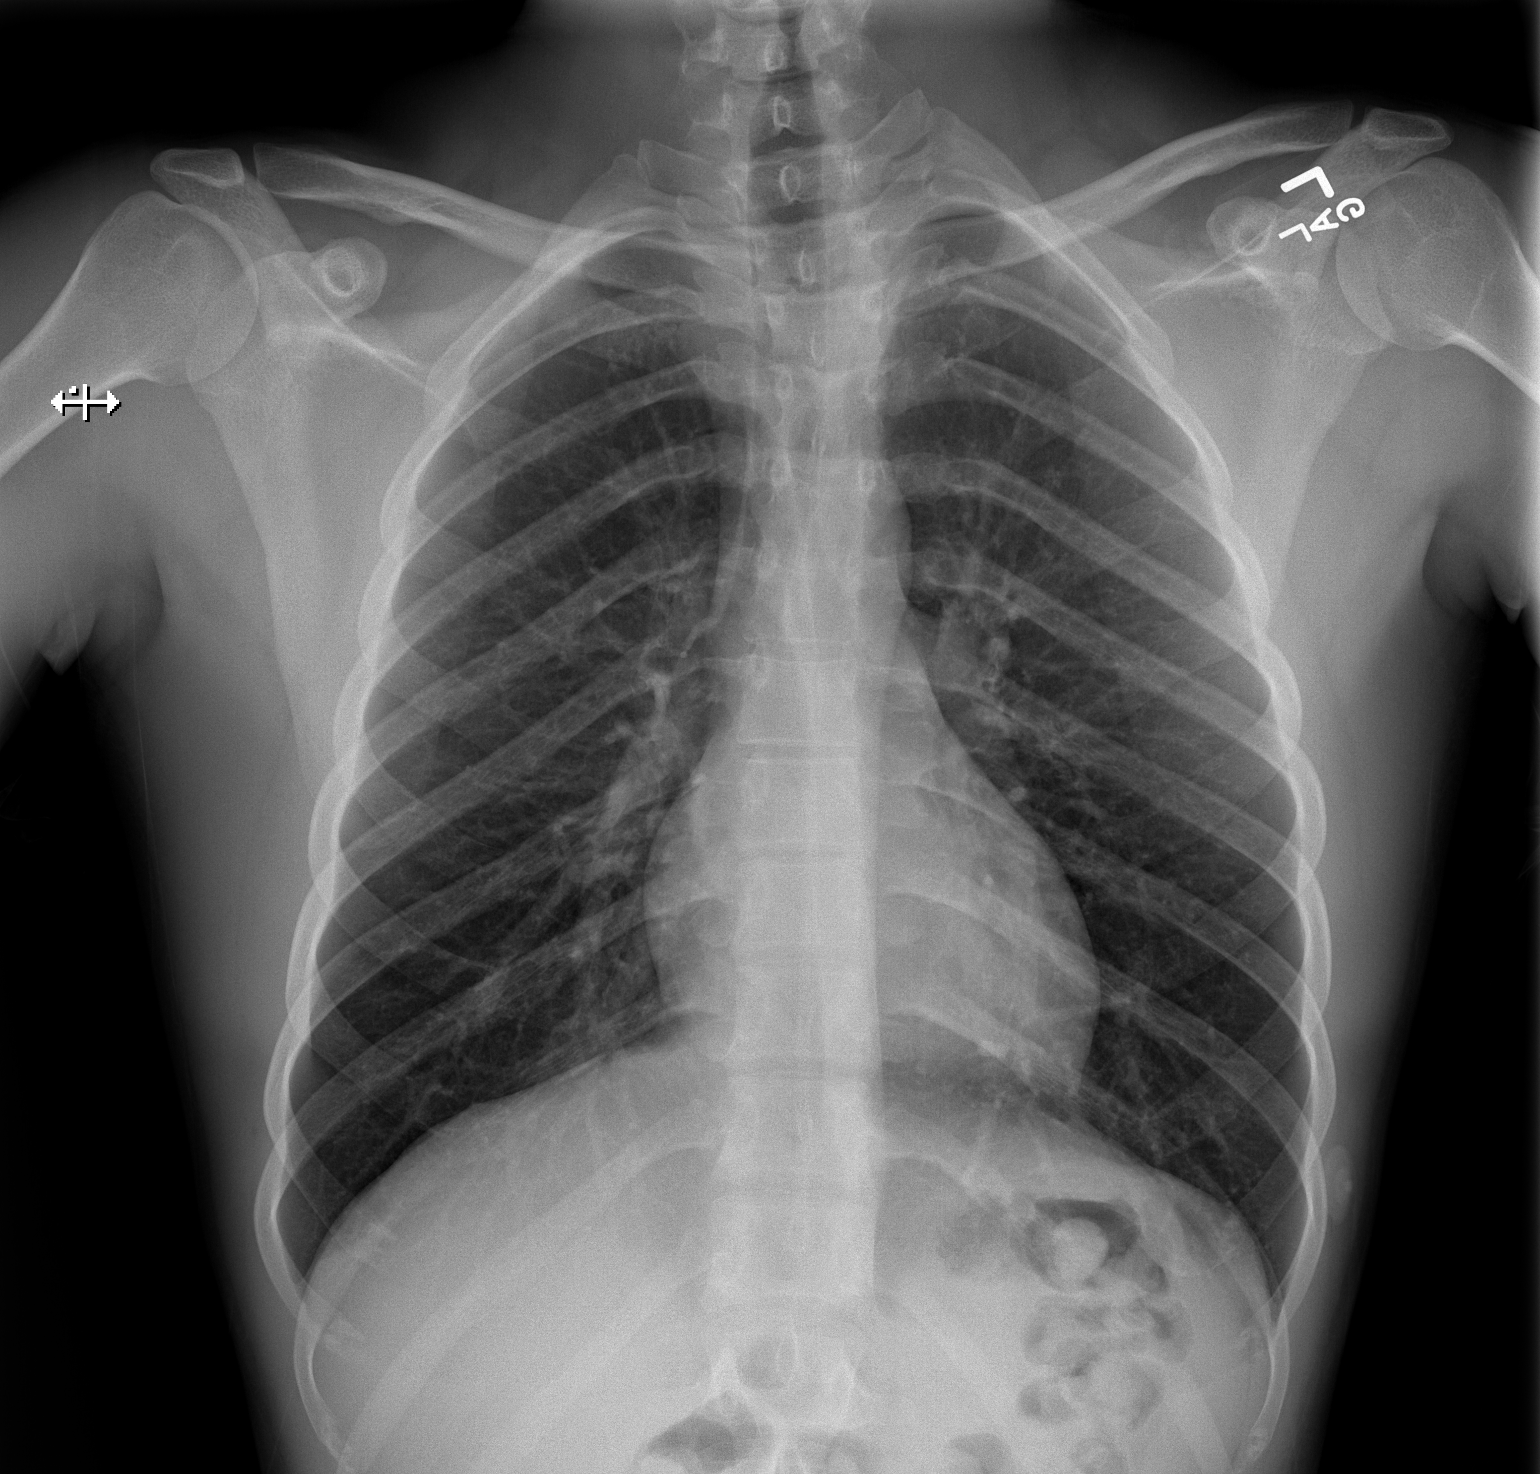

[w chest lat]
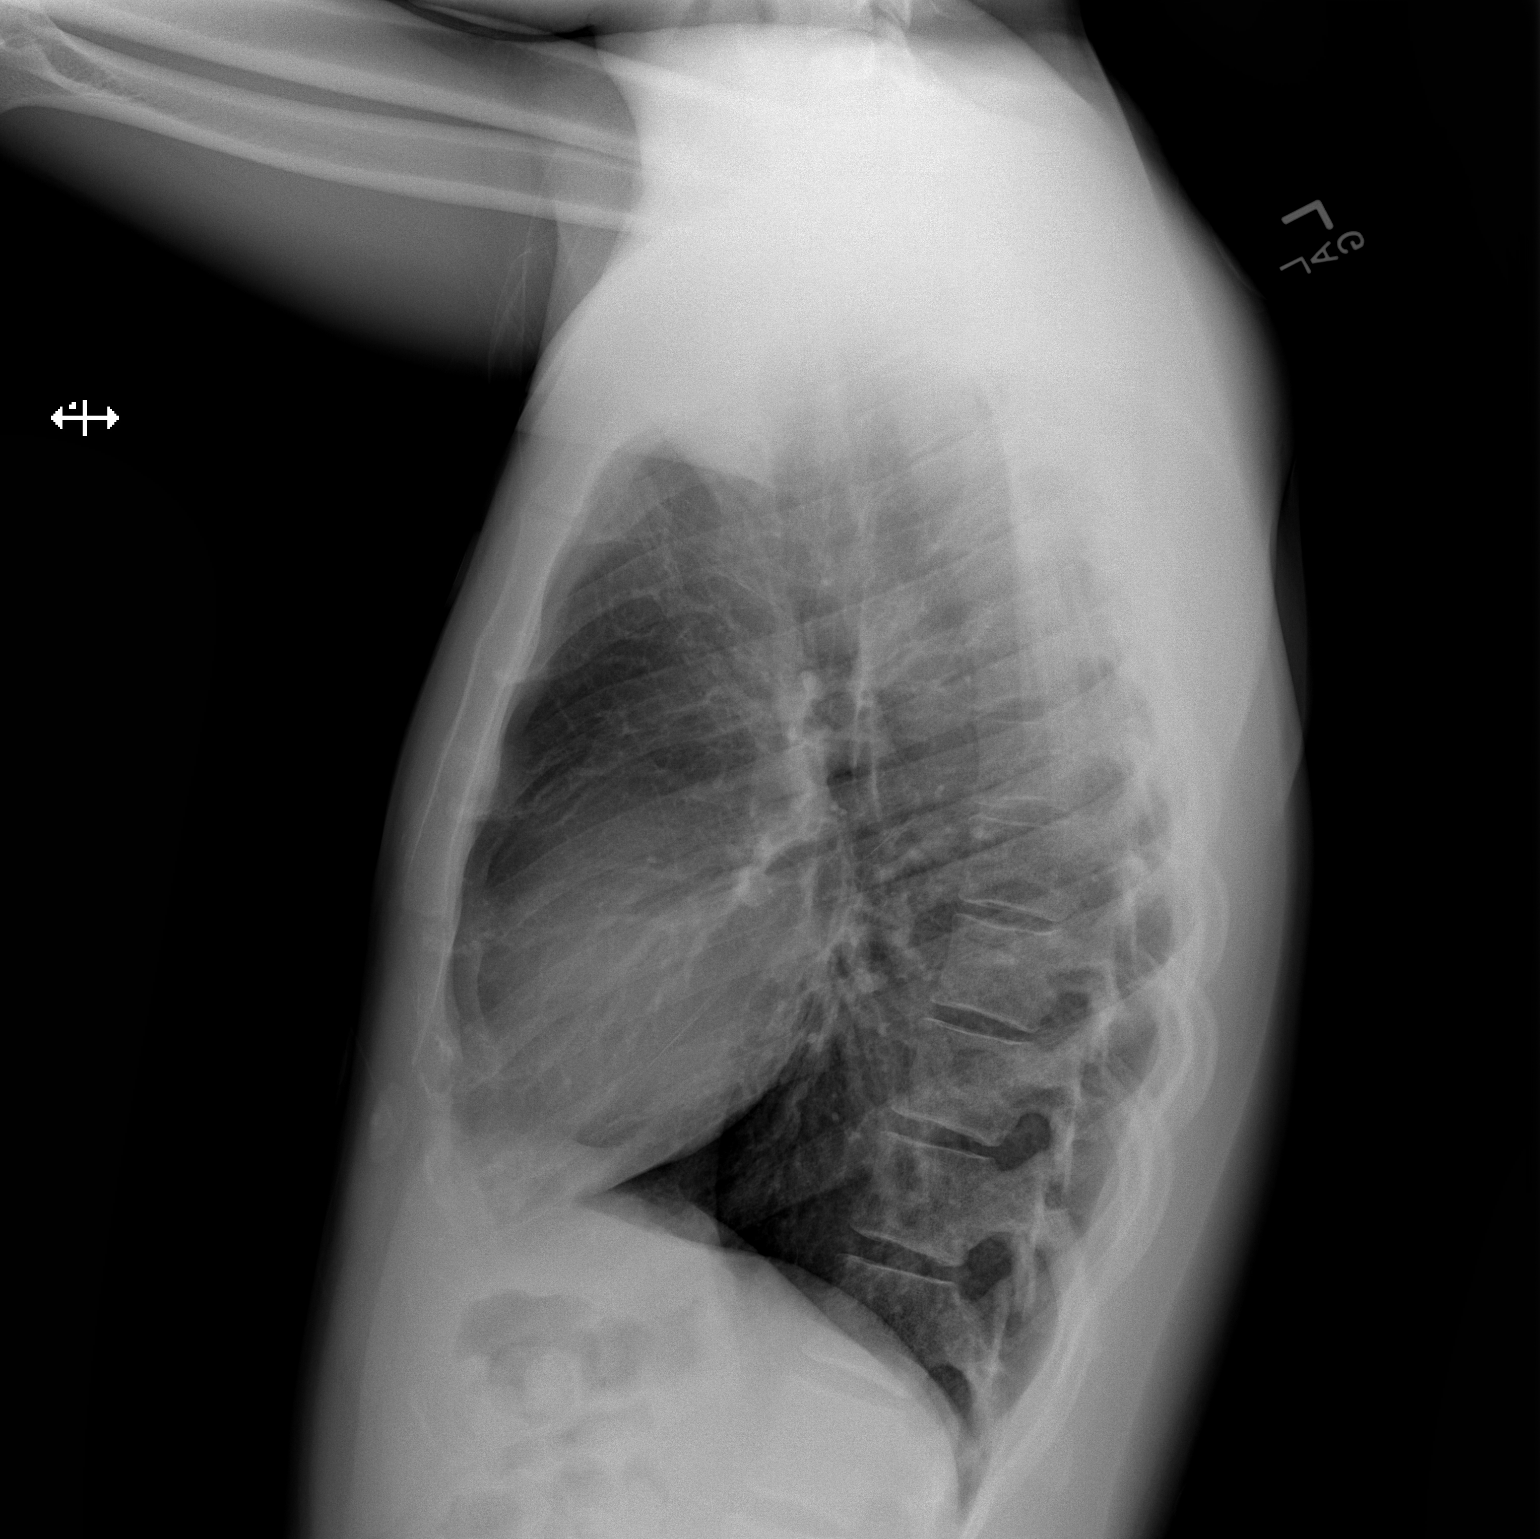

[2 of 2 positions shown; findings below may reference images not displayed]

FINDINGS: Normal heart size, mediastinal contours, and pulmonary vascularity.

Lungs clear.

No pleural effusion or pneumothorax.

Bones unremarkable.
IMPRESSION: Normal exam.

## 2017-08-30 ENCOUNTER — Emergency Department (HOSPITAL_BASED_OUTPATIENT_CLINIC_OR_DEPARTMENT_OTHER)
Admission: EM | Admit: 2017-08-30 | Discharge: 2017-08-30 | Disposition: A | Discharge status: Home / Self Care | Payer: Self-pay | Attending: Emergency Medicine | Admitting: Emergency Medicine

## 2017-08-30 ENCOUNTER — Encounter (HOSPITAL_BASED_OUTPATIENT_CLINIC_OR_DEPARTMENT_OTHER): Payer: Self-pay | Admitting: *Deleted

## 2017-08-30 ENCOUNTER — Other Ambulatory Visit: Payer: Self-pay

## 2017-08-30 DIAGNOSIS — Z79899 Other long term (current) drug therapy: Secondary | ICD-10-CM | POA: Insufficient documentation

## 2017-08-30 DIAGNOSIS — M62838 Other muscle spasm: Secondary | ICD-10-CM | POA: Insufficient documentation

## 2017-08-30 MED ORDER — IBUPROFEN 600 MG PO TABS: 600.0000 mg | ORAL_TABLET | Freq: Four times a day (QID) | ORAL | 0 refills | Status: DC | PRN

## 2017-08-30 MED ORDER — LIDOCAINE 5 % EX PTCH
1.0000 | MEDICATED_PATCH | CUTANEOUS | Status: DC
  Filled 2017-08-30: qty 1

## 2017-08-30 MED ORDER — CYCLOBENZAPRINE HCL 5 MG PO TABS: 5.0000 mg | ORAL_TABLET | Freq: Three times a day (TID) | ORAL | 0 refills | Status: DC | PRN

## 2017-08-30 MED ORDER — LIDOCAINE 4 % EX CREA
TOPICAL_CREAM | CUTANEOUS | Status: AC
  Administered 2017-08-30: 1 via TOPICAL
  Filled 2017-08-30: qty 5

## 2017-08-30 MED ORDER — LIDOCAINE 4 % EX CREA
TOPICAL_CREAM | Freq: Once | CUTANEOUS | Status: AC
  Administered 2017-08-30: 1 via TOPICAL

## 2017-08-30 MED ORDER — IBUPROFEN 400 MG PO TABS
400.0000 mg | ORAL_TABLET | Freq: Once | ORAL | Status: AC
Start: 2017-08-30 — End: 2017-08-30
  Administered 2017-08-30: 400 mg via ORAL
  Filled 2017-08-30: qty 1

## 2017-08-30 NOTE — Discharge Instructions (Signed)
Take motrin for pain.   Take flexeril for muscle spasms.   Keep the lidocaine patch on for 24 hours to help with spasms.   See your doctor  Return to ER if you have worse neck pain, arm pain or weakness, fever, headaches

## 2017-08-30 NOTE — ED Provider Notes (Signed)
[facility] EMERGENCY DEPARTMENT Provider Note   CSN: [id number] Arrival date & time: 08/30/17  4098     History   Chief Complaint Chief Complaint  Patient presents with  . Neck Pain    HPI Eric Cisneros is a 27 y.o. male hx of reflux, HL, here presenting with right-sided neck pain.  Patient states that he woke up this morning with right-sided neck pain.  He thought that he may have strained his neck in his sleep.  Patient denies any trauma or injury.  Patient denies any headache or vomiting or fever.  Patient took some ibuprofen with minimal relief.  He denies any history of neck trauma or chronic neck pain.  Patient was unable to sleep due to pain so came in the ED for evaluation  The history is provided by the patient.    Past Medical History:  Diagnosis Date  . GERD (gastroesophageal reflux disease)   . Hyperlipidemia   . Tachycardia     Patient Active Problem List   Diagnosis Date Noted  . Atypical chest pain 05/29/2015  . Gastroesophageal reflux disease without esophagitis 10/25/2014  . Visit for preventive health examination 07/21/2014  . Immunity status testing 07/21/2014  . Routine general medical examination at a health care facility 08/01/2011  . Cerumen impaction 08/01/2011    Past Surgical History:  Procedure Laterality Date  . NO PAST SURGERIES          Home Medications    Prior to Admission medications   Medication Sig Start Date End Date Taking? Authorizing Provider  acetaminophen (TYLENOL) 500 MG tablet Take 500 mg by mouth every 6 (six) hours as needed for mild pain.    [provider]  Cyanocobalamin (B-12 PO) Take 1 tablet by mouth daily.    [provider]  ferrous sulfate 325 (65 FE) MG tablet Take 325 mg by mouth daily with breakfast.    [provider]  omeprazole (PRILOSEC) 20 MG capsule Take 1 capsule (20 mg total) by mouth daily. 05/30/16   Muthersbaugh, Dahlia Client, PA-C    Family History Family  History  Problem Relation Age of Onset  . Hyperlipidemia Mother   . Hypertension Father     Social History Social History   Tobacco Use  . Smoking status: Never Smoker  . Smokeless tobacco: Never Used  Substance Use Topics  . Alcohol use: Yes    Alcohol/week: 0.0 oz    Comment: Occasionally  . Drug use: No     Allergies   Patient has no known allergies.   Review of Systems Review of Systems  Musculoskeletal: Positive for neck pain.  All other systems reviewed and are negative.    Physical Exam Updated Vital Signs BP 117/76 (BP Location: Right Arm)   Pulse 82   Temp 98 F (36.7 C) (Oral)   Resp 16   Ht 5\' 9"  (1.753 m)   Wt 65.8 kg (145 lb)   SpO2 100%   BMI 21.41 kg/m   Physical Exam  Constitutional: He is oriented to person, place, and time. He appears well-developed and well-nourished.  Slightly uncomfortable   HENT:  Head: Normocephalic and atraumatic.  Mouth/Throat: Oropharynx is clear and moist.  Eyes: Pupils are equal, round, and reactive to light. Conjunctivae and EOM are normal.  Neck:  No midline spinal tenderness, some R paracervical and trapezius tenderness   Cardiovascular: Normal rate, regular rhythm and normal heart sounds.  Pulmonary/Chest: Effort normal and breath sounds normal. No stridor. No  respiratory distress. He has no wheezes.  Abdominal: Soft. Bowel sounds are normal. He exhibits no distension. There is no tenderness. There is no guarding.  Musculoskeletal: Normal range of motion. He exhibits no edema.  Neurological: He is alert and oriented to person, place, and time. No cranial nerve deficit. Coordination normal.  CN 2- 12 intact. Nl strength throughout.   Skin: Skin is warm.  Psychiatric: He has a normal mood and affect.  Nursing note and vitals reviewed.    ED Treatments / Results  Labs (all labs ordered are listed, but only abnormal results are displayed) Labs Reviewed - No data to display  EKG None  Radiology No  results found.  Procedures Procedures (including critical care time)  Medications Ordered in ED Medications  ibuprofen (ADVIL,MOTRIN) tablet 400 mg (has no administration in time range)     Initial Impression / Assessment and Plan / ED Course  I have reviewed the triage vital signs and the nursing notes.  Pertinent labs & imaging results that were available during my care of the patient were reviewed by me and considered in my medical decision making (see chart for details).     Eric Cisneros is a 27 y.o. male here with R sided neck pain. Likely neck strain. He has tenderness over the trapezius so I suspect trapezius muscle spasms. Will give motrin, flexeril. Will try lidocaine patch as well. Stable for dc home.    Final Clinical Impressions(s) / ED Diagnoses   Final diagnoses:  None    ED Discharge Orders    None       Charlynne PanderYao, Keighan Amezcua Hsienta, MD 08/30/17 [id number]

## 2017-08-30 NOTE — ED Triage Notes (Signed)
Pt c/o right neck pain that started this morning.  Thinks he may have "slept wrong" hurts to turn head from side to side. Took ibuprofen one hour pta. Denies any injury

## 2018-03-01 IMAGING — DX DG CHEST 2V
2 series · 2 of 2 positions shown · non-contrast
Comparison: 01/02/2016

CLINICAL DATA: Chest pain

EXAM:
CHEST  2 VIEW

[chest pa]
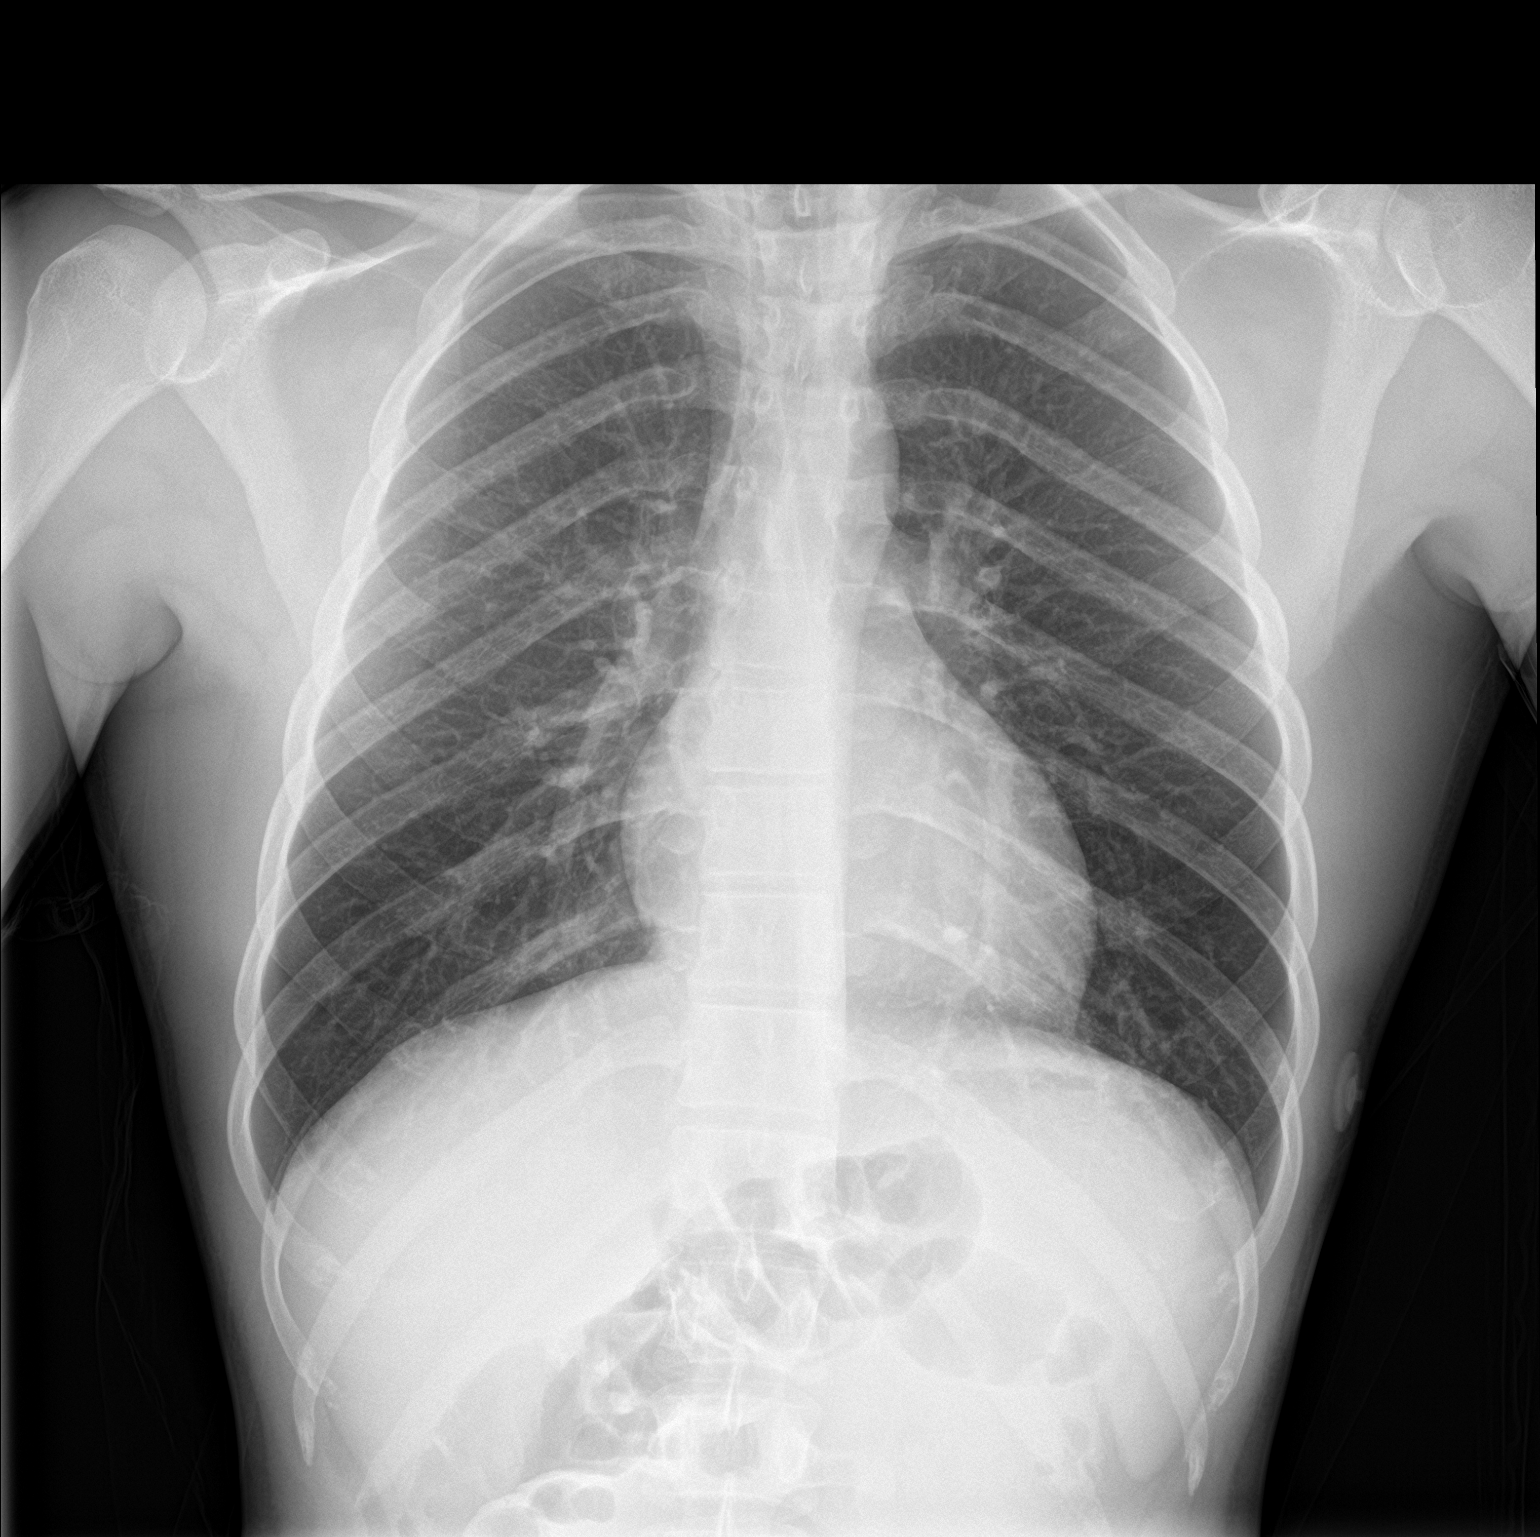

[chest lat]
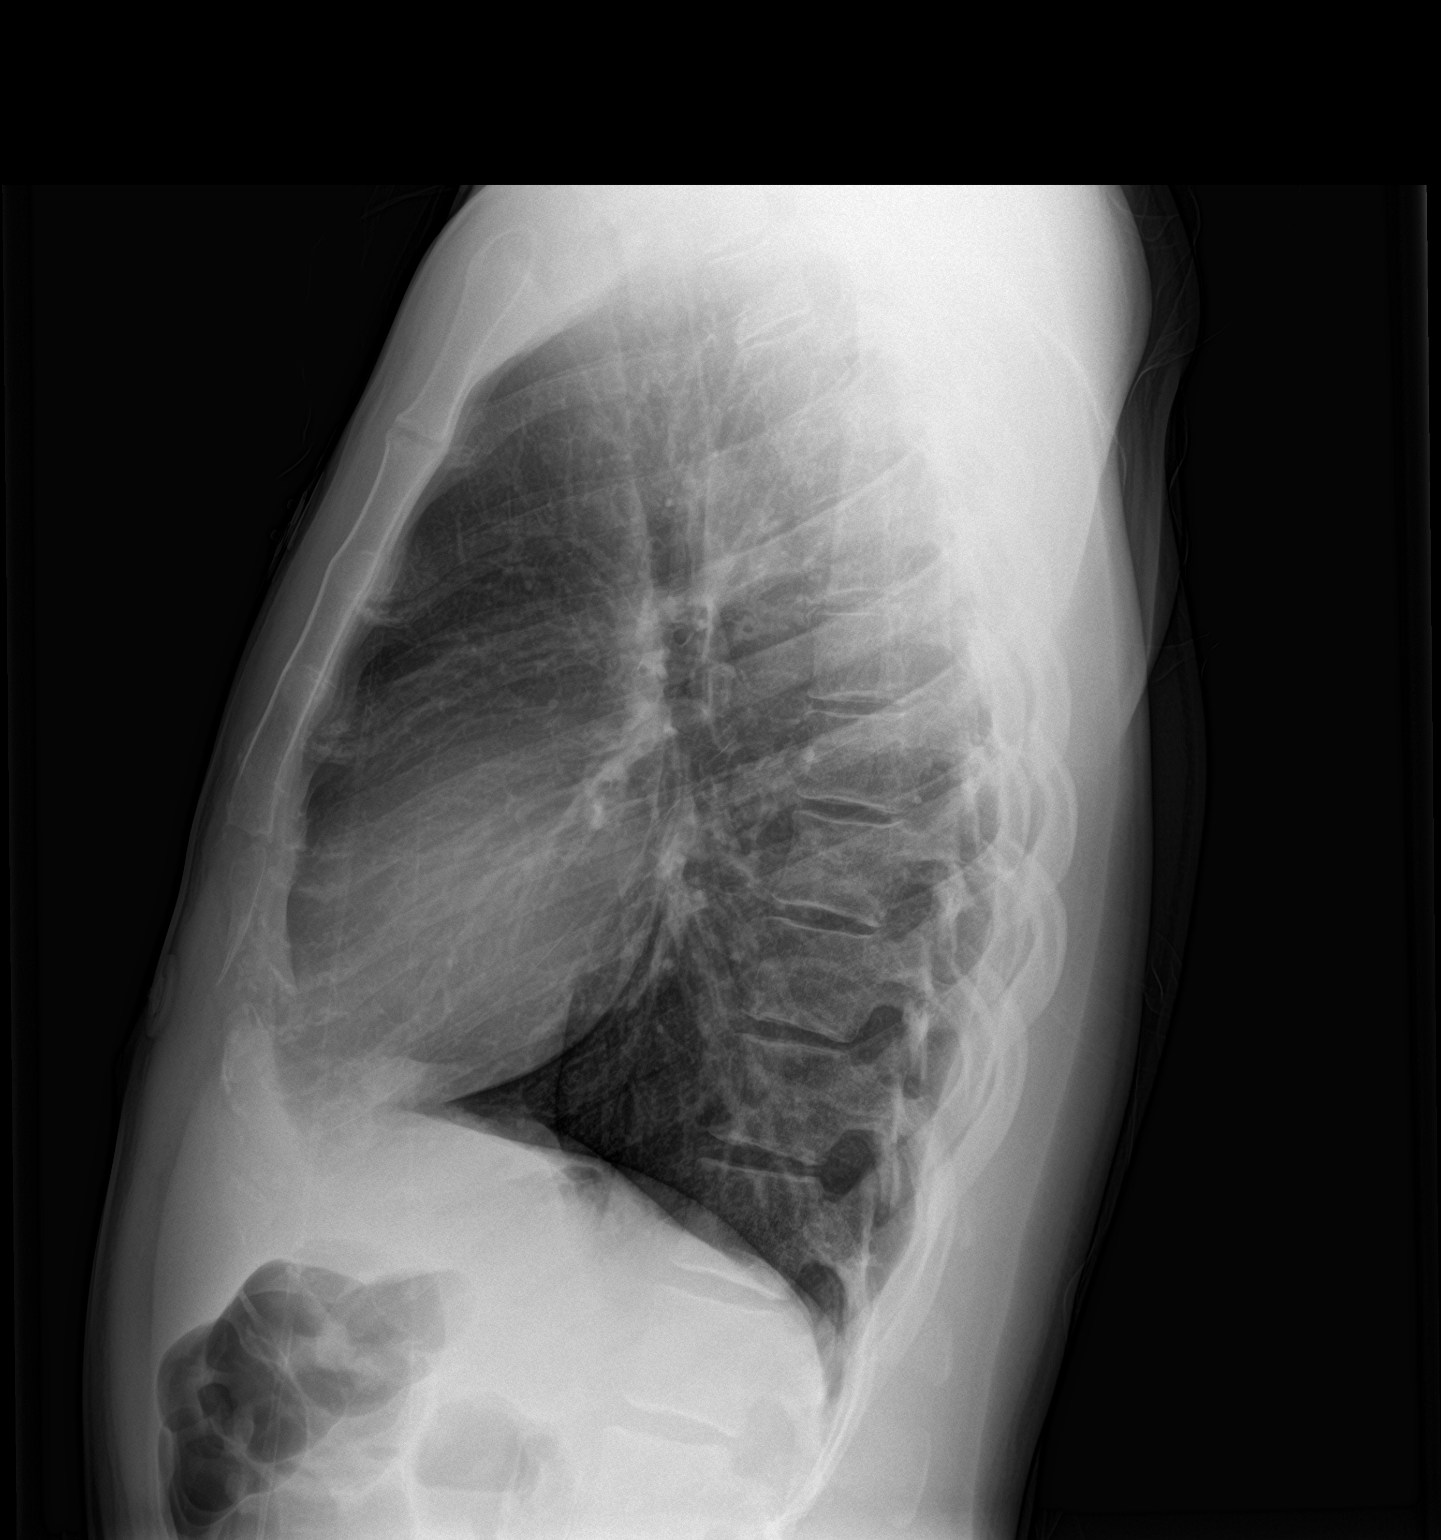

[2 of 2 positions shown; findings below may reference images not displayed]

FINDINGS: The heart size and mediastinal contours are within normal limits.
Both lungs are clear. The visualized skeletal structures are
unremarkable.
IMPRESSION: No active cardiopulmonary disease.

## 2018-11-10 ENCOUNTER — Encounter (HOSPITAL_BASED_OUTPATIENT_CLINIC_OR_DEPARTMENT_OTHER): Payer: Self-pay

## 2018-11-10 ENCOUNTER — Emergency Department (HOSPITAL_BASED_OUTPATIENT_CLINIC_OR_DEPARTMENT_OTHER): Payer: Self-pay

## 2018-11-10 ENCOUNTER — Emergency Department (HOSPITAL_BASED_OUTPATIENT_CLINIC_OR_DEPARTMENT_OTHER)
Admission: EM | Admit: 2018-11-10 | Discharge: 2018-11-10 | Disposition: A | Discharge status: Home / Self Care | Payer: Self-pay | Attending: Emergency Medicine | Admitting: Emergency Medicine

## 2018-11-10 ENCOUNTER — Other Ambulatory Visit: Payer: Self-pay

## 2018-11-10 DIAGNOSIS — F1210 Cannabis abuse, uncomplicated: Secondary | ICD-10-CM | POA: Insufficient documentation

## 2018-11-10 DIAGNOSIS — R0789 Other chest pain: Secondary | ICD-10-CM | POA: Insufficient documentation

## 2018-11-10 DIAGNOSIS — K219 Gastro-esophageal reflux disease without esophagitis: Secondary | ICD-10-CM | POA: Insufficient documentation

## 2018-11-10 LAB — CBC WITH DIFFERENTIAL/PLATELET
Abs Immature Granulocytes: 0.01 10*3/uL (ref 0.00–0.07)
Basophils Absolute: 0 10*3/uL (ref 0.0–0.1)
Basophils Relative: 0 %
Eosinophils Absolute: 0.1 10*3/uL (ref 0.0–0.5)
Eosinophils Relative: 1 %
HCT: 44.4 % (ref 39.0–52.0)
Hemoglobin: 14.7 g/dL (ref 13.0–17.0)
Immature Granulocytes: 0 %
Lymphocytes Relative: 28 %
Lymphs Abs: 1.3 10*3/uL (ref 0.7–4.0)
MCH: 27.4 pg (ref 26.0–34.0)
MCHC: 33.1 g/dL (ref 30.0–36.0)
MCV: 82.7 fL (ref 80.0–100.0)
Monocytes Absolute: 0.4 10*3/uL (ref 0.1–1.0)
Monocytes Relative: 8 %
Neutro Abs: 2.9 10*3/uL (ref 1.7–7.7)
Neutrophils Relative %: 63 %
Platelets: 167 10*3/uL (ref 150–400)
RBC: 5.37 MIL/uL (ref 4.22–5.81)
RDW: 12.3 % (ref 11.5–15.5)
WBC: 4.7 10*3/uL (ref 4.0–10.5)
nRBC: 0 % (ref 0.0–0.2)

## 2018-11-10 LAB — COMPREHENSIVE METABOLIC PANEL
ALT: 14 U/L (ref 0–44)
AST: 21 U/L (ref 15–41)
Albumin: 4.5 g/dL (ref 3.5–5.0)
Alkaline Phosphatase: 40 U/L (ref 38–126)
Anion gap: 10 (ref 5–15)
BUN: 10 mg/dL (ref 6–20)
CO2: 24 mmol/L (ref 22–32)
Calcium: 9.6 mg/dL (ref 8.9–10.3)
Chloride: 101 mmol/L (ref 98–111)
Creatinine, Ser: 0.95 mg/dL (ref 0.61–1.24)
GFR calc Af Amer: >60 mL/min (ref >60)
GFR calc non Af Amer: >60 mL/min (ref >60)
Glucose, Bld: 90 mg/dL (ref 70–99)
Potassium: 3.5 mmol/L (ref 3.5–5.1)
Sodium: 135 mmol/L (ref 135–145)
Total Bilirubin: 1.2 mg/dL (ref 0.3–1.2)
Total Protein: 7.6 g/dL (ref 6.5–8.1)

## 2018-11-10 LAB — RAPID URINE DRUG SCREEN, HOSP PERFORMED
Amphetamines: NOT DETECTED
Barbiturates: NOT DETECTED
Benzodiazepines: NOT DETECTED
Cocaine: NOT DETECTED
Opiates: NOT DETECTED
Tetrahydrocannabinol: POSITIVE — AB

## 2018-11-10 LAB — LIPASE, BLOOD: Lipase: 27 U/L (ref 11–51)

## 2018-11-10 MED ORDER — ALUM & MAG HYDROXIDE-SIMETH 200-200-20 MG/5ML PO SUSP
30.0000 mL | Freq: Once | ORAL | Status: AC
  Administered 2018-11-10: 30 mL via ORAL
  Filled 2018-11-10: qty 30

## 2018-11-10 MED ORDER — FAMOTIDINE 20 MG PO TABS: 20.0000 mg | ORAL_TABLET | Freq: Every day | ORAL | 0 refills | Status: DC

## 2018-11-10 MED ORDER — FAMOTIDINE 20 MG PO TABS
20.0000 mg | ORAL_TABLET | Freq: Once | ORAL | Status: AC
  Administered 2018-11-10: 20 mg via ORAL
  Filled 2018-11-10: qty 1

## 2018-11-10 NOTE — ED Notes (Signed)
MD at bedside. 

## 2018-11-10 NOTE — ED Notes (Signed)
Pt states chest pain continues, worse with movement of torso. VSS

## 2018-11-10 NOTE — ED Triage Notes (Signed)
Awoke 1am with central chest pain.  Denies shortness of breath. No nausea.  Hx heart murmur.

## 2018-11-10 NOTE — ED Provider Notes (Signed)
[facility] EMERGENCY DEPARTMENT Provider Note   CSN: [id number] Arrival date & time: 11/10/18  4332     History   Chief Complaint Chief Complaint  Patient presents with  . Chest Pain    HPI [name] is a 28 y.o. male.     28 year old male with past medical history including GERD who presents with chest pain.  Patient was trying to go to bed last night and at 1 AM he began having central to left-sided, constant, severe chest pressure.  He initially thought that it was related to reflux as he has had a history of reflux in the past but is not currently medicated.  He tried belching but symptoms did not subside.  His symptoms have been constant since they began.  No associated shortness of breath, nausea, vomiting, diarrhea, fevers, or cough/cold symptoms.  He reports some mild upper abdominal pain.  Denies alcohol or drug use.  No recent travel.  No personal or family history of heart disease.  He has not tried any medications for his symptoms.  He does admit that he ate greasy food before bed last night.  The history is provided by the patient.  Chest Pain   Past Medical History:  Diagnosis Date  . GERD (gastroesophageal reflux disease)   . Hyperlipidemia   . Tachycardia     Patient Active Problem List   Diagnosis Date Noted  . Atypical chest pain 05/29/2015  . Gastroesophageal reflux disease without esophagitis 10/25/2014  . Visit for preventive health examination 07/21/2014  . Immunity status testing 07/21/2014  . Routine general medical examination at a health care facility 08/01/2011  . Cerumen impaction 08/01/2011    Past Surgical History:  Procedure Laterality Date  . NO PAST SURGERIES          Home Medications    Prior to Admission medications   Medication Sig Start Date End Date Taking? Authorizing Provider  acetaminophen (TYLENOL) 500 MG tablet Take 500 mg by mouth every 6 (six) hours as needed for mild pain.    [provider]   famotidine (PEPCID) 20 MG tablet Take 1 tablet (20 mg total) by mouth daily. 11/10/18   , Wenda Overland, MD  ferrous sulfate 325 (65 FE) MG tablet Take 325 mg by mouth daily with breakfast.    [provider]  omeprazole (PRILOSEC) 20 MG capsule Take 1 capsule (20 mg total) by mouth daily. 05/30/16 11/10/18  Muthersbaugh, Jarrett Soho, PA-C    Family History Family History  Problem Relation Age of Onset  . Hyperlipidemia Mother   . Hypertension Father     Social History Social History   Tobacco Use  . Smoking status: Never Smoker  . Smokeless tobacco: Never Used  Substance Use Topics  . Alcohol use: Yes    Alcohol/week: 0.0 standard drinks    Comment: Occasionally  . Drug use: Yes    Frequency: 1.0 times per week    Types: Marijuana     Allergies   Patient has no known allergies.   Review of Systems Review of Systems  Cardiovascular: Positive for chest pain.   All other systems reviewed and are negative except that which was mentioned in HPI   Physical Exam Updated Vital Signs BP 111/70   Pulse 64   Temp 98.4 F (36.9 C) (Oral)   Resp 13   Ht 5\' 9"  (1.753 m)   Wt 65.8 kg   SpO2 99%   BMI 21.41 kg/m   Physical Exam  Vitals signs and nursing note reviewed.  Constitutional:      General: He is not in acute distress.    Appearance: He is well-developed.  HENT:     Head: Normocephalic and atraumatic.  Eyes:     Conjunctiva/sclera: Conjunctivae normal.  Neck:     Musculoskeletal: Neck supple.  Cardiovascular:     Rate and Rhythm: Normal rate and regular rhythm.     Heart sounds: Normal heart sounds. No murmur.  Pulmonary:     Effort: Pulmonary effort is normal.     Breath sounds: Normal breath sounds.  Abdominal:     General: Bowel sounds are normal. There is no distension.     Palpations: Abdomen is soft.     Tenderness: There is no abdominal tenderness.  Skin:    General: Skin is warm and dry.  Neurological:     Mental Status: He is alert  and oriented to person, place, and time.     Comments: Fluent speech  Psychiatric:        Judgment: Judgment normal.      ED Treatments / Results  Labs (all labs ordered are listed, but only abnormal results are displayed) Labs Reviewed  RAPID URINE DRUG SCREEN, HOSP PERFORMED - Abnormal; Notable for the following components:      Result Value   Tetrahydrocannabinol POSITIVE (*)    All other components within normal limits  COMPREHENSIVE METABOLIC PANEL  CBC WITH DIFFERENTIAL/PLATELET  LIPASE, BLOOD    EKG None  Radiology Dg Chest 2 View  Result Date: 11/10/2018 CLINICAL DATA:  Chest pain EXAM: CHEST - 2 VIEW COMPARISON:  05/29/2016 FINDINGS: The heart size and mediastinal contours are within normal limits. Both lungs are clear. The visualized skeletal structures are unremarkable. IMPRESSION: No acute abnormality of the lungs. Electronically Signed   By: Lauralyn PrimesAlex  Bibbey M.D.   On: 11/10/2018 11:07    Procedures Procedures (including critical care time)  Medications Ordered in ED Medications  alum & mag hydroxide-simeth (MAALOX/MYLANTA) 200-200-20 MG/5ML suspension 30 mL (30 mLs Oral Given 11/10/18 1056)  famotidine (PEPCID) tablet 20 mg (20 mg Oral Given 11/10/18 1056)     Initial Impression / Assessment and Plan / ED Course  I have reviewed the triage vital signs and the nursing notes.  Pertinent labs & imaging results that were available during my care of the patient were reviewed by me and considered in my medical decision making (see chart for details).       Comfortable on exam, reassuring vital signs, abdomen nontender.  EKG shows benign early repolarization consistent with old EKGs. Labs including trop negative, CXR normal.  Symptoms have been constant for greater than 9 hours at time of my evaluation and given normal EKG and troponin, I feel ACS is extremely unlikely.  Patient is PERC negative therefore PE very unlikely.  No ripping or tearing chest pain and no  description to suggest aortic dissection. I have discussed GERD treatment and supportive measures.  Return Precautions reviewed and he voiced understanding.  Final Clinical Impressions(s) / ED Diagnoses   Final diagnoses:  Atypical chest pain  Gastroesophageal reflux disease, esophagitis presence not specified    ED Discharge Orders         Ordered    famotidine (PEPCID) 20 MG tablet  Daily     11/10/18 1222           , Ambrose Finlandachel Morgan, MD 11/10/18 1224

## 2018-11-11 ENCOUNTER — Encounter (HOSPITAL_BASED_OUTPATIENT_CLINIC_OR_DEPARTMENT_OTHER): Payer: Self-pay | Admitting: *Deleted

## 2018-11-11 ENCOUNTER — Emergency Department (HOSPITAL_BASED_OUTPATIENT_CLINIC_OR_DEPARTMENT_OTHER)
Admission: EM | Admit: 2018-11-11 | Discharge: 2018-11-11 | Disposition: A | Discharge status: Home / Self Care | Payer: Self-pay | Attending: Emergency Medicine | Admitting: Emergency Medicine

## 2018-11-11 ENCOUNTER — Emergency Department (HOSPITAL_BASED_OUTPATIENT_CLINIC_OR_DEPARTMENT_OTHER): Payer: Self-pay

## 2018-11-11 ENCOUNTER — Other Ambulatory Visit: Payer: Self-pay

## 2018-11-11 DIAGNOSIS — Y929 Unspecified place or not applicable: Secondary | ICD-10-CM | POA: Insufficient documentation

## 2018-11-11 DIAGNOSIS — S62316A Displaced fracture of base of fifth metacarpal bone, right hand, initial encounter for closed fracture: Secondary | ICD-10-CM | POA: Insufficient documentation

## 2018-11-11 DIAGNOSIS — Z79899 Other long term (current) drug therapy: Secondary | ICD-10-CM | POA: Insufficient documentation

## 2018-11-11 DIAGNOSIS — S62339A Displaced fracture of neck of unspecified metacarpal bone, initial encounter for closed fracture: Secondary | ICD-10-CM

## 2018-11-11 DIAGNOSIS — Y9389 Activity, other specified: Secondary | ICD-10-CM | POA: Insufficient documentation

## 2018-11-11 DIAGNOSIS — W2209XA Striking against other stationary object, initial encounter: Secondary | ICD-10-CM | POA: Insufficient documentation

## 2018-11-11 DIAGNOSIS — Y999 Unspecified external cause status: Secondary | ICD-10-CM | POA: Insufficient documentation

## 2018-11-11 MED ORDER — HYDROCODONE-ACETAMINOPHEN 5-325 MG PO TABS: 1.0000 | ORAL_TABLET | Freq: Four times a day (QID) | ORAL | 0 refills | Status: DC | PRN

## 2018-11-11 MED ORDER — IBUPROFEN 600 MG PO TABS: 600.0000 mg | ORAL_TABLET | Freq: Four times a day (QID) | ORAL | 0 refills | Status: DC | PRN

## 2018-11-11 MED ORDER — HYDROCODONE-ACETAMINOPHEN 5-325 MG PO TABS
1.0000 | ORAL_TABLET | Freq: Once | ORAL | Status: AC
  Administered 2018-11-11: 1 via ORAL
  Filled 2018-11-11: qty 1

## 2018-11-11 MED FILL — HYDROCODON-APAP 5-325: 5-325 | 2 days supply | Qty: 8 | Fill #0

## 2018-11-11 MED FILL — IBUPROFEN 600 MG TABLET: 600 | 7 days supply | Qty: 30 | Fill #0

## 2018-11-11 NOTE — ED Notes (Signed)
Ulnar gutter splint done by Ms. Woodward Ku, RN

## 2018-11-11 NOTE — ED Provider Notes (Signed)
[facility] EMERGENCY DEPARTMENT Provider Note   CSN: [id number] Arrival date & time: 11/11/18  1357     History   Chief Complaint Chief Complaint  Patient presents with  . Hand Injury    HPI Eric Cisneros is a 28 y.o. male.     The history is provided by the patient and medical records. No language interpreter was used.  Hand Injury  Eric Cisneros is a 28 y.o. male  with a PMH as listed below who presents to the Emergency Department complaining of acute onset of throbbing right hand pain after punching a wall with a closed fist about an hour ago.  No numbness or weakness.  Associated swelling.  Pain with range of motion of the hand.  No wrist pain.  Has tried ice with a little improvement.  No other treatments tried.  Past Medical History:  Diagnosis Date  . GERD (gastroesophageal reflux disease)   . Hyperlipidemia   . Tachycardia     Patient Active Problem List   Diagnosis Date Noted  . Atypical chest pain 05/29/2015  . Gastroesophageal reflux disease without esophagitis 10/25/2014  . Visit for preventive health examination 07/21/2014  . Immunity status testing 07/21/2014  . Routine general medical examination at a health care facility 08/01/2011  . Cerumen impaction 08/01/2011    Past Surgical History:  Procedure Laterality Date  . NO PAST SURGERIES          Home Medications    Prior to Admission medications   Medication Sig Start Date End Date Taking? Authorizing Provider  acetaminophen (TYLENOL) 500 MG tablet Take 500 mg by mouth every 6 (six) hours as needed for mild pain.    [provider]  famotidine (PEPCID) 20 MG tablet Take 1 tablet (20 mg total) by mouth daily. 11/10/18   Little, Ambrose Finland, MD  ferrous sulfate 325 (65 FE) MG tablet Take 325 mg by mouth daily with breakfast.    [provider]  HYDROcodone-acetaminophen (NORCO/VICODIN) 5-325 MG tablet Take 1 tablet by mouth every 6 (six) hours as needed for severe  pain. 11/11/18   Red Mandt, Chase Picket, PA-C  ibuprofen (ADVIL) 600 MG tablet Take 1 tablet (600 mg total) by mouth every 6 (six) hours as needed. 11/11/18   Larnie Heart, Chase Picket, PA-C  omeprazole (PRILOSEC) 20 MG capsule Take 1 capsule (20 mg total) by mouth daily. 05/30/16 11/10/18  Muthersbaugh, Dahlia Client, PA-C    Family History Family History  Problem Relation Age of Onset  . Hyperlipidemia Mother   . Hypertension Father     Social History Social History   Tobacco Use  . Smoking status: Never Smoker  . Smokeless tobacco: Never Used  Substance Use Topics  . Alcohol use: Yes    Alcohol/week: 0.0 standard drinks    Comment: Occasionally  . Drug use: Yes    Frequency: 1.0 times per week    Types: Marijuana     Allergies   Patient has no known allergies.   Review of Systems Review of Systems  Musculoskeletal: Positive for arthralgias and myalgias.  Skin: Negative for color change and wound.  Neurological: Negative for weakness and numbness.     Physical Exam Updated Vital Signs BP 131/90   Pulse 64   Temp 98.2 F (36.8 C)   Resp 15   Ht 5\' 9"  (1.753 m)   Wt 65.8 kg   SpO2 100%   BMI 21.41 kg/m   Physical Exam Vitals signs and nursing note reviewed.  Constitutional:      General: He is not in acute distress.    Appearance: He is well-developed.  HENT:     Head: Normocephalic and atraumatic.  Neck:     Musculoskeletal: Neck supple.  Cardiovascular:     Rate and Rhythm: Normal rate and regular rhythm.     Heart sounds: Normal heart sounds. No murmur.  Pulmonary:     Effort: Pulmonary effort is normal. No respiratory distress.     Breath sounds: Normal breath sounds. No wheezing or rales.  Musculoskeletal:     Comments: Tenderness and swelling to the right fifth metacarpal.  No open wounds.  Slightly decreased grip strength likely due to pain.  2+ radial pulse.  Sensation intact to ulnar, radial and median nerve distributions.  Good cap refill.  Skin:     General: Skin is warm and dry.  Neurological:     Mental Status: He is alert.      ED Treatments / Results  Labs (all labs ordered are listed, but only abnormal results are displayed) Labs Reviewed - No data to display  EKG None  Radiology Dg Chest 2 View  Result Date: 11/10/2018 CLINICAL DATA:  Chest pain EXAM: CHEST - 2 VIEW COMPARISON:  05/29/2016 FINDINGS: The heart size and mediastinal contours are within normal limits. Both lungs are clear. The visualized skeletal structures are unremarkable. IMPRESSION: No acute abnormality of the lungs. Electronically Signed   By: Eddie Candle M.D.   On: 11/10/2018 11:07   Dg Hand Complete Right  Result Date: 11/11/2018 CLINICAL DATA:  Pain and swelling of the right hand after punching a wall today. EXAM: RIGHT HAND - COMPLETE 3+ VIEW COMPARISON:  None. FINDINGS: There is a slightly angulated boxer's fracture of the distal fifth metacarpal. Slight adjacent soft tissue swelling. Otherwise, normal. IMPRESSION: Boxer's fracture of the distal fifth metacarpal. Electronically Signed   By: Lorriane Shire M.D.   On: 11/11/2018 14:20    Procedures Procedures (including critical care time)  SPLINT APPLICATION Date/Time: 1:30 PM Authorized by: Ozella Almond Uma Jerde Consent: Verbal consent obtained. Risks and benefits: risks, benefits and alternatives were discussed Consent given by: patient Splint applied by: Nursing staff Location details: Right UE Splint type: ulnar gutter Supplies used: Orthoglass Post-procedure: The splinted body part was neurovascularly unchanged following the procedure. Patient tolerance: Patient tolerated the procedure well with no immediate complications.    Medications Ordered in ED Medications  HYDROcodone-acetaminophen (NORCO/VICODIN) 5-325 MG per tablet 1 tablet (1 tablet Oral Given 11/11/18 1419)     Initial Impression / Assessment and Plan / ED Course  I have reviewed the triage vital signs and the nursing  notes.  Pertinent labs & imaging results that were available during my care of the patient were reviewed by me and considered in my medical decision making (see chart for details).  Clinical Course as of Nov 10 1444  Thu Nov 10, 5424  1531 28 year old right-hand-dominant male punched a wall complaining of right fifth metacarpal pain.  X-ray shows boxer's fracture.  He was placed in a splint and gently manipulated to try to get better reduction.  Tolerated well.  Normal CSMs after manipulation.   [MB]    Clinical Course User Index [MB] Hayden Rasmussen, MD      Sara Zimmerle is a 28 y.o. male who presents to ED for right hand pain after punching a wall with a closed fist about an hour ago.  Neurovascularly intact on exam, but does have tenderness  and swelling to the fifth metacarpal region concerning for fracture.  Will obtain x-ray to further evaluate.  X-ray reviewed showing boxer fx. Placed in ulnar gutter splint. Hand follow up. Return precautions and home care instructions discussed. All questions answered.   Final Clinical Impressions(s) / ED Diagnoses   Final diagnoses:  Closed boxer's fracture, initial encounter    ED Discharge Orders         Ordered    ibuprofen (ADVIL) 600 MG tablet  Every 6 hours PRN     11/11/18 1442    HYDROcodone-acetaminophen (NORCO/VICODIN) 5-325 MG tablet  Every 6 hours PRN     11/11/18 1442           Keyonni Percival, Chase PicketJaime Pilcher, PA-C 11/11/18 1446    Terrilee FilesButler, Michael C, MD 11/11/18 1704

## 2018-11-11 NOTE — ED Notes (Signed)
Bump to right knuckle of little finger noted.

## 2018-11-11 NOTE — ED Notes (Signed)
ED Provider at bedside. 

## 2018-11-11 NOTE — ED Notes (Signed)
Patient ambulated to X-ray 

## 2018-11-11 NOTE — Discharge Instructions (Signed)
It was my pleasure taking care of you today!   Ibuprofen as needed for mild to moderate pain.  Norco only as needed for severe pain.   Please call the orthopedic physician listed today or first thing in the morning to schedule a follow up appointment.   It is very important to keep your splint dry until your follow up with the orthopedic doctor and a cast can be applied. You may place a plastic bag around the extremity with the splint while bathing to keep it dry. Also try to sleep with the extremity elevated for the next several nights to decrease swelling. Check the fingertips several times per day to make sure they are not cold, pale, or blue. If this is the case, the splint may be too tight and should return to the ER, your regular doctor or the orthopedist for recheck. Return to the ER for new or worsening symptoms, any additional concerns.   COLD THERAPY DIRECTIONS:  Ice or gel packs can be used to reduce both pain and swelling. Ice is the most helpful within the first 24 to 48 hours after an injury or flareup from overusing a muscle or joint.  Ice is effective, has very few side effects, and is safe for most people to use.   If you expose your skin to cold temperatures for too long or without the proper protection, you can damage your skin or nerves. Watch for signs of skin damage due to cold.   HOME CARE INSTRUCTIONS  Follow these tips to use ice and cold packs safely.  Place a dry or damp towel between the ice and skin. A damp towel will cool the skin more quickly, so you may need to shorten the time that the ice is used.  For a more rapid response, add gentle compression to the ice.  Ice for no more than 10 to 20 minutes at a time. The bonier the area you are icing, the less time it will take to get the benefits of ice.  Check your skin after 5 minutes to make sure there are no signs of a poor response to cold or skin damage.  Rest 20 minutes or more in between uses.  Once your skin is  numb, you can end your treatment. You can test numbness by very lightly touching your skin. The touch should be so light that you do not see the skin dimple from the pressure of your fingertip. When using ice, most people will feel these normal sensations in this order: cold, burning, aching, and numbness.

## 2018-11-11 NOTE — ED Triage Notes (Signed)
Pt c/o right hand injury after punching wall x 1 hr ago

## 2019-06-14 ENCOUNTER — Emergency Department (HOSPITAL_BASED_OUTPATIENT_CLINIC_OR_DEPARTMENT_OTHER)
Admission: EM | Admit: 2019-06-14 | Discharge: 2019-06-14 | Disposition: A | Discharge status: Home / Self Care | Payer: Self-pay | Attending: Emergency Medicine | Admitting: Emergency Medicine

## 2019-06-14 ENCOUNTER — Encounter (HOSPITAL_BASED_OUTPATIENT_CLINIC_OR_DEPARTMENT_OTHER): Payer: Self-pay

## 2019-06-14 ENCOUNTER — Other Ambulatory Visit: Payer: Self-pay

## 2019-06-14 DIAGNOSIS — W500XXA Accidental hit or strike by another person, initial encounter: Secondary | ICD-10-CM | POA: Insufficient documentation

## 2019-06-14 DIAGNOSIS — Y9367 Activity, basketball: Secondary | ICD-10-CM | POA: Insufficient documentation

## 2019-06-14 DIAGNOSIS — Y999 Unspecified external cause status: Secondary | ICD-10-CM | POA: Insufficient documentation

## 2019-06-14 DIAGNOSIS — S0502XA Injury of conjunctiva and corneal abrasion without foreign body, left eye, initial encounter: Secondary | ICD-10-CM | POA: Insufficient documentation

## 2019-06-14 DIAGNOSIS — Z87891 Personal history of nicotine dependence: Secondary | ICD-10-CM | POA: Insufficient documentation

## 2019-06-14 DIAGNOSIS — E785 Hyperlipidemia, unspecified: Secondary | ICD-10-CM | POA: Insufficient documentation

## 2019-06-14 DIAGNOSIS — Y92310 Basketball court as the place of occurrence of the external cause: Secondary | ICD-10-CM | POA: Insufficient documentation

## 2019-06-14 MED ORDER — TETRACAINE HCL 0.5 % OP SOLN
2.0000 [drp] | Freq: Once | OPHTHALMIC | Status: AC
  Administered 2019-06-14: 2 [drp] via OPHTHALMIC
  Filled 2019-06-14: qty 4

## 2019-06-14 MED ORDER — OFLOXACIN 0.3 % OP SOLN: 1.0000 [drp] | Freq: Four times a day (QID) | OPHTHALMIC | 0 refills | Status: AC

## 2019-06-14 MED ORDER — FLUORESCEIN SODIUM 1 MG OP STRP
1.0000 | ORAL_STRIP | Freq: Once | OPHTHALMIC | Status: AC
  Administered 2019-06-14: 1 via OPHTHALMIC
  Filled 2019-06-14: qty 1

## 2019-06-14 MED FILL — OFLOXACIN 0.3% EYE DROPS: 0.3 | 25 days supply | Qty: 5 | Fill #0

## 2019-06-14 NOTE — ED Notes (Signed)
Pt ambulatory with steady gait 

## 2019-06-14 NOTE — ED Notes (Signed)
Pt discharged to home. Discharge instructions have been discussed with patient and/or family members. Pt verbally acknowledges understanding d/c instructions, and endorses comprehension to checkout at registration before leaving.  °

## 2019-06-14 NOTE — Discharge Instructions (Signed)
Use antibiotic drops as prescribed. Continue using Tylenol and ibuprofen as needed for pain. Use cool compresses to help with pain and swelling. Call the ophthalmologist listed below if your symptoms not improving within the next 24 hours. Return to the emergency room if you develop severe worsening pain, vision loss, pus (thick white) drainage from your eye, or any new, worsening, or concerning symptoms.

## 2019-06-14 NOTE — ED Triage Notes (Signed)
Pt arrives with c/o pain to left eye X2 days after being poked in the eye while playing basketball.

## 2019-06-14 NOTE — ED Provider Notes (Signed)
[facility] EMERGENCY DEPARTMENT Provider Note   CSN: [id number] Arrival date & time: 06/14/19  7026     History Chief Complaint  Patient presents with  . Eye Pain    Eric Cisneros is a 29 y.o. male presenting for evaluation of left eye pain.  Patient states 2 days ago he was playing basketball when he was poked in the left eye accidentally.  Since then, he has been having pain and tearing of the left eye.  Pain has not worsened.  He does report both direct and consensual photophobia.  He reports mild swelling of the left eye.  He does not wear glasses or contacts.  He has been using an over-the-counter eye wash without improvement of symptoms.  He has also been taking ibuprofen.  He denies drainage from the eye.  He reports pain is worse when he closes his eye.  He denies pain with movement of his eye.  He denies symptoms on the right side.  He states his vision feels blurry, but states that will clear when he blinks a few times. He has never seen an ophthalmologist. He has no other medical problems, takes no medications daily.  HPI     Past Medical History:  Diagnosis Date  . GERD (gastroesophageal reflux disease)   . Hyperlipidemia   . Tachycardia     Patient Active Problem List   Diagnosis Date Noted  . Atypical chest pain 05/29/2015  . Gastroesophageal reflux disease without esophagitis 10/25/2014  . Visit for preventive health examination 07/21/2014  . Immunity status testing 07/21/2014  . Routine general medical examination at a health care facility 08/01/2011  . Cerumen impaction 08/01/2011    Past Surgical History:  Procedure Laterality Date  . NO PAST SURGERIES         Family History  Problem Relation Age of Onset  . Hyperlipidemia Mother   . Hypertension Father     Social History   Tobacco Use  . Smoking status: Never Smoker  . Smokeless tobacco: Never Used  Substance Use Topics  . Alcohol use: Not Currently    Alcohol/week: 0.0  standard drinks    Comment: Occasionally  . Drug use: Not Currently    Frequency: 1.0 times per week    Types: Marijuana    Home Medications Prior to Admission medications   Medication Sig Start Date End Date Taking? Authorizing Provider  acetaminophen (TYLENOL) 500 MG tablet Take 500 mg by mouth every 6 (six) hours as needed for mild pain.    [provider]  famotidine (PEPCID) 20 MG tablet Take 1 tablet (20 mg total) by mouth daily. 11/10/18   Little, Ambrose Finland, MD  ferrous sulfate 325 (65 FE) MG tablet Take 325 mg by mouth daily with breakfast.    [provider]  HYDROcodone-acetaminophen (NORCO/VICODIN) 5-325 MG tablet Take 1 tablet by mouth every 6 (six) hours as needed for severe pain. 11/11/18   Ward, Chase Picket, PA-C  ibuprofen (ADVIL) 600 MG tablet Take 1 tablet (600 mg total) by mouth every 6 (six) hours as needed. 11/11/18   Ward, Chase Picket, PA-C  ofloxacin (OCUFLOX) 0.3 % ophthalmic solution Place 1 drop into the left eye 4 (four) times daily for 5 days. 06/14/19 06/19/19  Darrick Greenlaw, PA-C  omeprazole (PRILOSEC) 20 MG capsule Take 1 capsule (20 mg total) by mouth daily. 05/30/16 11/10/18  Muthersbaugh, Dahlia Client, PA-C    Allergies    Patient has no known allergies.  Review of Systems  Review of Systems  Constitutional: Negative for fever.  Eyes: Positive for photophobia, pain and redness.  Allergic/Immunologic: Negative for immunocompromised state.    Physical Exam Updated Vital Signs BP 120/89 (BP Location: Left Arm)   Pulse 67   Temp 98.2 F (36.8 C) (Oral)   Resp 16   Ht 5\' 9"  (1.753 m)   Wt 67.6 kg   SpO2 100%   BMI 22.00 kg/m   Physical Exam Vitals and nursing note reviewed.  Constitutional:      General: He is not in acute distress.    Appearance: He is well-developed.  HENT:     Head: Normocephalic and atraumatic.  Eyes:     Intraocular pressure: Right eye pressure is 12 mmHg. Left eye pressure is 13 mmHg. Measurements  were taken using a handheld tonometer.    Extraocular Movements: Extraocular movements intact.     Conjunctiva/sclera:     Left eye: Left conjunctiva is injected.     Comments: Small, superficial laceration of the left upper lateral eye.  No surrounding erythema or edema.  No drainage.  Mild periorbital swelling of the left eye Sclera slightly injected at the left eye.  EOMI and PERRLA. Visual acuity: reassuring Fluorescein stain: Small abrasion at 10:00 over the iris.  Negative Seidel sign. IOP: reassuring bilaterally  Pulmonary:     Effort: Pulmonary effort is normal.  Abdominal:     General: There is no distension.  Musculoskeletal:        General: Normal range of motion.     Cervical back: Normal range of motion.  Skin:    General: Skin is warm.     Findings: No rash.  Neurological:     Mental Status: He is alert and oriented to person, place, and time.     ED Results / Procedures / Treatments   Labs (all labs ordered are listed, but only abnormal results are displayed) Labs Reviewed - No data to display  EKG None  Radiology No results found.  Procedures Procedures (including critical care time)  Medications Ordered in ED Medications  fluorescein ophthalmic strip 1 strip (1 strip Left Eye Given by Other 06/14/19 0955)  tetracaine (PONTOCAINE) 0.5 % ophthalmic solution 2 drop (2 drops Left Eye Given by Other 06/14/19 06/16/19)    ED Course  I have reviewed the triage vital signs and the nursing notes.  Pertinent labs & imaging results that were available during my care of the patient were reviewed by me and considered in my medical decision making (see chart for details).    MDM Rules/Calculators/A&P                      Patient presenting for evaluation of left eye pain after being poked in the eye 2 days ago.  On exam, sclera slightly injected, eyes slightly swollen.  Fluorescein stain shows small corneal abrasion.  Intraocular pressures reassuring bilaterally.   Visual acuity reassuring.  Will have patient use antibiotic drops to prevent infection and continue to treat symptomatically.  Will have him follow-up with ophthalmology closely.  At this time, patient appears safe for discharge.  Return precautions given.  Patient states he understands and agrees to plan.  Final Clinical Impression(s) / ED Diagnoses Final diagnoses:  Abrasion of left cornea, initial encounter    Rx / DC Orders ED Discharge Orders         Ordered    ofloxacin (OCUFLOX) 0.3 % ophthalmic solution  4 times daily  06/14/19 [place], Kindle Strohmeier, PA-C 06/14/19 1023    Hayden Rasmussen, MD 06/14/19 1729

## 2019-06-14 NOTE — ED Notes (Signed)
Work note provided per patient request

## 2019-08-05 ENCOUNTER — Encounter (HOSPITAL_BASED_OUTPATIENT_CLINIC_OR_DEPARTMENT_OTHER): Payer: Self-pay | Admitting: Emergency Medicine

## 2019-08-05 ENCOUNTER — Emergency Department (HOSPITAL_BASED_OUTPATIENT_CLINIC_OR_DEPARTMENT_OTHER)
Admission: EM | Admit: 2019-08-05 | Discharge: 2019-08-05 | Disposition: A | Discharge status: Home / Self Care | Payer: Self-pay | Attending: Emergency Medicine | Admitting: Emergency Medicine

## 2019-08-05 ENCOUNTER — Emergency Department (HOSPITAL_BASED_OUTPATIENT_CLINIC_OR_DEPARTMENT_OTHER): Payer: Self-pay

## 2019-08-05 ENCOUNTER — Other Ambulatory Visit: Payer: Self-pay

## 2019-08-05 DIAGNOSIS — Z791 Long term (current) use of non-steroidal anti-inflammatories (NSAID): Secondary | ICD-10-CM | POA: Insufficient documentation

## 2019-08-05 DIAGNOSIS — Y9389 Activity, other specified: Secondary | ICD-10-CM | POA: Insufficient documentation

## 2019-08-05 DIAGNOSIS — Y9289 Other specified places as the place of occurrence of the external cause: Secondary | ICD-10-CM | POA: Insufficient documentation

## 2019-08-05 DIAGNOSIS — X500XXA Overexertion from strenuous movement or load, initial encounter: Secondary | ICD-10-CM | POA: Insufficient documentation

## 2019-08-05 DIAGNOSIS — S29012A Strain of muscle and tendon of back wall of thorax, initial encounter: Secondary | ICD-10-CM | POA: Insufficient documentation

## 2019-08-05 DIAGNOSIS — Z79899 Other long term (current) drug therapy: Secondary | ICD-10-CM | POA: Insufficient documentation

## 2019-08-05 DIAGNOSIS — Y999 Unspecified external cause status: Secondary | ICD-10-CM | POA: Insufficient documentation

## 2019-08-05 DIAGNOSIS — R0789 Other chest pain: Secondary | ICD-10-CM | POA: Insufficient documentation

## 2019-08-05 LAB — CBC WITH DIFFERENTIAL/PLATELET
Abs Immature Granulocytes: 0.01 10*3/uL (ref 0.00–0.07)
Basophils Absolute: 0 10*3/uL (ref 0.0–0.1)
Basophils Relative: 1 %
Eosinophils Absolute: 0.1 10*3/uL (ref 0.0–0.5)
Eosinophils Relative: 2 %
HCT: 46.4 % (ref 39.0–52.0)
Hemoglobin: 15 g/dL (ref 13.0–17.0)
Immature Granulocytes: 0 %
Lymphocytes Relative: 25 %
Lymphs Abs: 1.1 10*3/uL (ref 0.7–4.0)
MCH: 27.7 pg (ref 26.0–34.0)
MCHC: 32.3 g/dL (ref 30.0–36.0)
MCV: 85.8 fL (ref 80.0–100.0)
Monocytes Absolute: 0.4 10*3/uL (ref 0.1–1.0)
Monocytes Relative: 9 %
Neutro Abs: 2.6 10*3/uL (ref 1.7–7.7)
Neutrophils Relative %: 63 %
Platelets: 163 10*3/uL (ref 150–400)
RBC: 5.41 MIL/uL (ref 4.22–5.81)
RDW: 13.2 % (ref 11.5–15.5)
WBC: 4.1 10*3/uL (ref 4.0–10.5)
nRBC: 0 % (ref 0.0–0.2)

## 2019-08-05 LAB — BASIC METABOLIC PANEL
Anion gap: 9 (ref 5–15)
BUN: 9 mg/dL (ref 6–20)
CO2: 28 mmol/L (ref 22–32)
Calcium: 9.3 mg/dL (ref 8.9–10.3)
Chloride: 102 mmol/L (ref 98–111)
Creatinine, Ser: 1.19 mg/dL (ref 0.61–1.24)
GFR calc Af Amer: >60 mL/min (ref >60)
GFR calc non Af Amer: >60 mL/min (ref >60)
Glucose, Bld: 100 mg/dL — ABNORMAL HIGH (ref 70–99)
Potassium: 4.2 mmol/L (ref 3.5–5.1)
Sodium: 139 mmol/L (ref 135–145)

## 2019-08-05 LAB — D-DIMER, QUANTITATIVE: D-Dimer, Quant: 0.28 ug/mL-FEU (ref 0.00–0.50)

## 2019-08-05 MED ORDER — KETOROLAC TROMETHAMINE 15 MG/ML IJ SOLN
15.0000 mg | Freq: Once | INTRAMUSCULAR | Status: AC
  Administered 2019-08-05: 15 mg via INTRAVENOUS
  Filled 2019-08-05: qty 1

## 2019-08-05 MED ORDER — NAPROXEN 375 MG PO TABS: 375.0000 mg | ORAL_TABLET | Freq: Two times a day (BID) | ORAL | 0 refills | Status: DC

## 2019-08-05 NOTE — ED Provider Notes (Signed)
[facility] EMERGENCY DEPARTMENT Provider Note   CSN: [id number] Arrival date & time: 08/05/19  9326     History Chief Complaint  Patient presents with  . Back Pain    Eric Cisneros is a 29 y.o. male.  HPI 29 year old male presents with a left thoracic back pain.  Started about a week ago.  No acute injury.  He does lift things at work but does not remember injuring himself.  The pain is going a little bit into his left mid axillary chest.  Pain is constant.  Worse with deep inspiration and some movement/twisting.  No numbness or weakness in his extremities.  No chest pain or shortness of breath.  Has tried some Tylenol and ibuprofen with no relief.   Past Medical History:  Diagnosis Date  . GERD (gastroesophageal reflux disease)   . Hyperlipidemia   . Tachycardia     Patient Active Problem List   Diagnosis Date Noted  . Atypical chest pain 05/29/2015  . Gastroesophageal reflux disease without esophagitis 10/25/2014  . Visit for preventive health examination 07/21/2014  . Immunity status testing 07/21/2014  . Routine general medical examination at a health care facility 08/01/2011  . Cerumen impaction 08/01/2011    Past Surgical History:  Procedure Laterality Date  . NO PAST SURGERIES         Family History  Problem Relation Age of Onset  . Hyperlipidemia Mother   . Hypertension Father     Social History   Tobacco Use  . Smoking status: Never Smoker  . Smokeless tobacco: Never Used  Substance Use Topics  . Alcohol use: Not Currently    Alcohol/week: 0.0 standard drinks    Comment: Occasionally  . Drug use: Not Currently    Frequency: 1.0 times per week    Types: Marijuana    Home Medications Prior to Admission medications   Medication Sig Start Date End Date Taking? Authorizing Provider  acetaminophen (TYLENOL) 500 MG tablet Take 500 mg by mouth every 6 (six) hours as needed for mild pain.    [provider]  famotidine (PEPCID)  20 MG tablet Take 1 tablet (20 mg total) by mouth daily. 11/10/18   Little, Ambrose Finland, MD  ferrous sulfate 325 (65 FE) MG tablet Take 325 mg by mouth daily with breakfast.    [provider]  HYDROcodone-acetaminophen (NORCO/VICODIN) 5-325 MG tablet Take 1 tablet by mouth every 6 (six) hours as needed for severe pain. 11/11/18   Ward, Chase Picket, PA-C  ibuprofen (ADVIL) 600 MG tablet Take 1 tablet (600 mg total) by mouth every 6 (six) hours as needed. 11/11/18   Ward, Chase Picket, PA-C  naproxen (NAPROSYN) 375 MG tablet Take 1 tablet (375 mg total) by mouth 2 (two) times daily with a meal. 08/05/19   Pricilla Loveless, MD  omeprazole (PRILOSEC) 20 MG capsule Take 1 capsule (20 mg total) by mouth daily. 05/30/16 11/10/18  Muthersbaugh, Dahlia Client, PA-C    Allergies    Patient has no known allergies.  Review of Systems   Review of Systems  Respiratory: Negative for shortness of breath.   Cardiovascular: Positive for chest pain.  Gastrointestinal: Negative for abdominal pain.  Musculoskeletal: Positive for back pain.  Neurological: Negative for weakness and numbness.  All other systems reviewed and are negative.   Physical Exam Updated Vital Signs BP 119/66 (BP Location: Right Arm)   Pulse (!) 52   Temp 98.1 F (36.7 C) (Oral)   Resp 14   SpO2  100%   Physical Exam Vitals and nursing note reviewed.  Constitutional:      Appearance: He is well-developed.  HENT:     Head: Normocephalic and atraumatic.     Right Ear: External ear normal.     Left Ear: External ear normal.     Nose: Nose normal.  Eyes:     General:        Right eye: No discharge.        Left eye: No discharge.  Cardiovascular:     Rate and Rhythm: Normal rate and regular rhythm.     Heart sounds: Normal heart sounds.  Pulmonary:     Effort: Pulmonary effort is normal.     Breath sounds: Normal breath sounds.  Chest:     Chest wall: No tenderness.  Abdominal:     Palpations: Abdomen is soft.      Tenderness: There is no abdominal tenderness.  Musculoskeletal:     Cervical back: Neck supple.     Thoracic back: No tenderness.  Skin:    General: Skin is warm and dry.  Neurological:     Mental Status: He is alert.  Psychiatric:        Mood and Affect: Mood is not anxious.     ED Results / Procedures / Treatments   Labs (all labs ordered are listed, but only abnormal results are displayed) Labs Reviewed  BASIC METABOLIC PANEL - Abnormal; Notable for the following components:      Result Value   Glucose, Bld 100 (*)    All other components within normal limits  CBC WITH DIFFERENTIAL/PLATELET  D-DIMER, QUANTITATIVE (NOT AT [facility])    EKG EKG Interpretation  Date/Time:  Friday August 05 2019 12:03:41 EDT Ventricular Rate:  51 PR Interval:  164 QRS Duration: 92 QT Interval:  396 QTC Calculation: 364 R Axis:   90 Text Interpretation: Sinus bradycardia Rightward axis diffuse ST elevation c/w early repol similar to Sept 2020 Confirmed by Sherwood Gambler [phone]) on 08/05/2019 12:13:22 PM   Radiology DG Chest 2 View  Result Date: 08/05/2019 CLINICAL DATA:  LEFT chest and back pain EXAM: CHEST - 2 VIEW COMPARISON:  11/10/2018 FINDINGS: Normal heart size, mediastinal contours, and pulmonary vascularity. Lungs clear. No pleural effusion or pneumothorax. Minimal levoconvex scoliosis upper thoracic spine. IMPRESSION: No acute abnormalities. Minimal thoracic levoconvex scoliosis. Electronically Signed   By: Lavonia Dana M.D.   On: 08/05/2019 13:32    Procedures Procedures (including critical care time)  Medications Ordered in ED Medications  ketorolac (TORADOL) 15 MG/ML injection 15 mg (15 mg Intravenous Given 08/05/19 1237)    ED Course  I have reviewed the triage vital signs and the nursing notes.  Pertinent labs & imaging results that were available during my care of the patient were reviewed by me and considered in my medical decision making (see chart for details).    MDM  Rules/Calculators/A&P                          Given no reproducible back pain, I did a brief work-up for low risk PE with negative D-dimer.  No hypoxia.  I do not think he needs further work-up for PE.  ECG was personally reviewed and is unremarkable.  Chest x-ray reviewed and is benign.  Labs otherwise reassuring.  He feels better with some Toradol.  Will prescribe Naprosyn and otherwise supportive care and exercises. No neuro complaints. Final Clinical Impression(s) / ED  Diagnoses Final diagnoses:  Strain of thoracic back region    Rx / DC Orders ED Discharge Orders         Ordered    naproxen (NAPROSYN) 375 MG tablet  2 times daily with meals     Discontinue  Reprint     08/05/19 1408           Pricilla Loveless, MD 08/05/19 1442

## 2019-08-05 NOTE — Discharge Instructions (Addendum)
If you develop worsening, recurrent, or continued back pain, numbness or weakness in the legs, incontinence of your bowels or bladders, numbness of your buttocks, fever, abdominal pain, or any other new/concerning symptoms then return to the ER for evaluation.   You are being prescribed Naproxen. Do not take Ibuprofen/Advil/Aleve/Motrin/Goody Powders/Naproxen/BC powders/Meloxicam/Diclofenac/Indomethacin and other Nonsteroidal anti-inflammatory medications   

## 2019-08-05 NOTE — ED Triage Notes (Signed)
PT here with left sided upper back pain x 1 week. Thinks he slept on it wrong, but it hasn't gotten better.

## 2019-08-05 NOTE — ED Notes (Signed)
IV attempt unsuccessful

## 2020-07-24 ENCOUNTER — Emergency Department (HOSPITAL_BASED_OUTPATIENT_CLINIC_OR_DEPARTMENT_OTHER)
Admission: EM | Admit: 2020-07-24 | Discharge: 2020-07-24 | Disposition: A | Discharge status: Home / Self Care | Payer: Self-pay | Attending: Emergency Medicine | Admitting: Emergency Medicine

## 2020-07-24 ENCOUNTER — Other Ambulatory Visit: Payer: Self-pay

## 2020-07-24 ENCOUNTER — Emergency Department (HOSPITAL_BASED_OUTPATIENT_CLINIC_OR_DEPARTMENT_OTHER): Payer: Self-pay

## 2020-07-24 ENCOUNTER — Encounter (HOSPITAL_BASED_OUTPATIENT_CLINIC_OR_DEPARTMENT_OTHER): Payer: Self-pay | Admitting: *Deleted

## 2020-07-24 DIAGNOSIS — R079 Chest pain, unspecified: Secondary | ICD-10-CM

## 2020-07-24 DIAGNOSIS — R072 Precordial pain: Secondary | ICD-10-CM | POA: Insufficient documentation

## 2020-07-24 LAB — BASIC METABOLIC PANEL
Anion gap: 7 (ref 5–15)
BUN: 12 mg/dL (ref 6–20)
CO2: 23 mmol/L (ref 22–32)
Calcium: 9 mg/dL (ref 8.9–10.3)
Chloride: 106 mmol/L (ref 98–111)
Creatinine, Ser: 1.01 mg/dL (ref 0.61–1.24)
GFR, Estimated: >60 mL/min (ref >60)
Glucose, Bld: 89 mg/dL (ref 70–99)
Potassium: 3.9 mmol/L (ref 3.5–5.1)
Sodium: 136 mmol/L (ref 135–145)

## 2020-07-24 LAB — CBC WITH DIFFERENTIAL/PLATELET
Abs Immature Granulocytes: 0.01 10*3/uL (ref 0.00–0.07)
Basophils Absolute: 0 10*3/uL (ref 0.0–0.1)
Basophils Relative: 1 %
Eosinophils Absolute: 0.1 10*3/uL (ref 0.0–0.5)
Eosinophils Relative: 1 %
HCT: 43.6 % (ref 39.0–52.0)
Hemoglobin: 14.5 g/dL (ref 13.0–17.0)
Immature Granulocytes: 0 %
Lymphocytes Relative: 22 %
Lymphs Abs: 1.4 10*3/uL (ref 0.7–4.0)
MCH: 28.3 pg (ref 26.0–34.0)
MCHC: 33.3 g/dL (ref 30.0–36.0)
MCV: 85.2 fL (ref 80.0–100.0)
Monocytes Absolute: 0.6 10*3/uL (ref 0.1–1.0)
Monocytes Relative: 10 %
Neutro Abs: 4.1 10*3/uL (ref 1.7–7.7)
Neutrophils Relative %: 66 %
Platelets: 154 10*3/uL (ref 150–400)
RBC: 5.12 MIL/uL (ref 4.22–5.81)
RDW: 13.4 % (ref 11.5–15.5)
WBC: 6.2 10*3/uL (ref 4.0–10.5)
nRBC: 0 % (ref 0.0–0.2)

## 2020-07-24 LAB — TROPONIN I (HIGH SENSITIVITY): Troponin I (High Sensitivity): <2 ng/L (ref <18)

## 2020-07-24 MED ORDER — FAMOTIDINE 20 MG PO TABS
20.0000 mg | ORAL_TABLET | Freq: Once | ORAL | Status: AC
  Administered 2020-07-24: 20 mg via ORAL
  Filled 2020-07-24: qty 1

## 2020-07-24 MED ORDER — LIDOCAINE VISCOUS HCL 2 % MT SOLN
15.0000 mL | Freq: Once | OROMUCOSAL | Status: AC
  Administered 2020-07-24: 15 mL via ORAL
  Filled 2020-07-24: qty 15

## 2020-07-24 MED ORDER — HYDROXYZINE HCL 25 MG PO TABS: 25.0000 mg | ORAL_TABLET | Freq: Four times a day (QID) | ORAL | 0 refills | Status: AC

## 2020-07-24 MED ORDER — FAMOTIDINE 20 MG PO TABS: 20.0000 mg | ORAL_TABLET | Freq: Two times a day (BID) | ORAL | 0 refills | Status: DC

## 2020-07-24 MED ORDER — ALUM & MAG HYDROXIDE-SIMETH 200-200-20 MG/5ML PO SUSP
30.0000 mL | Freq: Once | ORAL | Status: AC
  Administered 2020-07-24: 30 mL via ORAL
  Filled 2020-07-24: qty 30

## 2020-07-24 NOTE — ED Notes (Signed)
Discharge instructions including prescription, discussed with pt. Pt was able to verbalize understanding with no questions at this time. Work note provided to pt. Pt ambulatory. Pt discharged home.

## 2020-07-24 NOTE — ED Provider Notes (Signed)
[facility] EMERGENCY DEPARTMENT Provider Note   CSN: [id number] Arrival date & time: 07/24/20  1703     History Chief Complaint  Patient presents with  . Chest Pain    Eric Cisneros is a 30 y.o. male.  HPI Patient is a 30 year old male presented today with chest pain states is been ongoing for 2 days it has been constant he describes it as stabbing and sharp located sternal and left-sided chest nonradiating he states that it feels exactly the same as his reflux symptoms and he has taken no medications for this however.  He has been prescribed Pepcid in the past however states he ran out of this.  He was unaware that this is an over-the-counter medicine.  No nausea vomiting shortness of breath lightheadedness or dizziness.  No recent surgeries, hospitalization, long travel, hemoptysis, estrogen containing OCP, cancer history.  No unilateral leg swelling.  No history of PE or VTE.   No other aggravating factors no other mitigating factors.  Nonexertional nonpleuritic.     Past Medical History:  Diagnosis Date  . GERD (gastroesophageal reflux disease)   . Hyperlipidemia   . Tachycardia     Patient Active Problem List   Diagnosis Date Noted  . Atypical chest pain 05/29/2015  . Gastroesophageal reflux disease without esophagitis 10/25/2014  . Visit for preventive health examination 07/21/2014  . Immunity status testing 07/21/2014  . Routine general medical examination at a health care facility 08/01/2011  . Cerumen impaction 08/01/2011    Past Surgical History:  Procedure Laterality Date  . NO PAST SURGERIES         Family History  Problem Relation Age of Onset  . Hyperlipidemia Mother   . Hypertension Father     Social History   Tobacco Use  . Smoking status: Never Smoker  . Smokeless tobacco: Never Used  Substance Use Topics  . Alcohol use: Not Currently    Alcohol/week: 0.0 standard drinks    Comment: Occasionally  . Drug use: Not Currently     Frequency: 1.0 times per week    Types: Marijuana    Home Medications Prior to Admission medications   Medication Sig Start Date End Date Taking? Authorizing Provider  famotidine (PEPCID) 20 MG tablet Take 1 tablet (20 mg total) by mouth 2 (two) times daily. 07/24/20 08/23/20 Yes Pami Wool S, PA  hydrOXYzine (ATARAX/VISTARIL) 25 MG tablet Take 1 tablet (25 mg total) by mouth every 6 (six) hours. 07/24/20 08/23/20 Yes Jony Ladnier, Stevphen Meuse S, PA  acetaminophen (TYLENOL) 500 MG tablet Take 500 mg by mouth every 6 (six) hours as needed for mild pain.    [provider]  ferrous sulfate 325 (65 FE) MG tablet Take 325 mg by mouth daily with breakfast.    [provider]  HYDROcodone-acetaminophen (NORCO/VICODIN) 5-325 MG tablet Take 1 tablet by mouth every 6 (six) hours as needed for severe pain. 11/11/18   Ward, Chase Picket, PA-C  ibuprofen (ADVIL) 600 MG tablet Take 1 tablet (600 mg total) by mouth every 6 (six) hours as needed. 11/11/18   Ward, Chase Picket, PA-C  naproxen (NAPROSYN) 375 MG tablet Take 1 tablet (375 mg total) by mouth 2 (two) times daily with a meal. 08/05/19   Pricilla Loveless, MD  omeprazole (PRILOSEC) 20 MG capsule Take 1 capsule (20 mg total) by mouth daily. 05/30/16 11/10/18  Muthersbaugh, Dahlia Client, PA-C    Allergies    Patient has no known allergies.  Review of Systems   Review of  Systems  Constitutional: Negative for chills and fever.  HENT: Negative for congestion.   Eyes: Negative for pain.  Respiratory: Negative for cough and shortness of breath.   Cardiovascular: Positive for chest pain. Negative for leg swelling.  Gastrointestinal: Negative for abdominal pain, diarrhea, nausea and vomiting.  Genitourinary: Negative for dysuria.  Musculoskeletal: Negative for myalgias.  Skin: Negative for rash.  Neurological: Negative for dizziness and headaches.    Physical Exam Updated Vital Signs BP 103/71   Pulse (!) 58   Temp 98.7 F (37.1 C) (Oral)    Resp 16   Ht 5\' 9"  (1.753 m)   Wt 68 kg   SpO2 100%   BMI 22.15 kg/m   Physical Exam Vitals and nursing note reviewed.  Constitutional:      General: He is not in acute distress.    Comments: Pleasant well-appearing 30 year old.  In no acute distress.  Sitting comfortably in bed.  Able answer questions appropriately follow commands. No increased work of breathing. Speaking in full sentences.   HENT:     Head: Normocephalic and atraumatic.     Nose: Nose normal.     Mouth/Throat:     Mouth: Mucous membranes are moist.  Eyes:     General: No scleral icterus. Cardiovascular:     Rate and Rhythm: Normal rate and regular rhythm.     Pulses: Normal pulses.     Heart sounds: Normal heart sounds.     Comments: BL radial art pulses 3+ and symmetric Pulmonary:     Effort: Pulmonary effort is normal. No respiratory distress.     Breath sounds: No wheezing.  Abdominal:     Palpations: Abdomen is soft.     Tenderness: There is no abdominal tenderness. There is no guarding or rebound.     Comments: Neg murphy sign  Musculoskeletal:     Cervical back: Normal range of motion.     Right lower leg: No edema.     Left lower leg: No edema.  Skin:    General: Skin is warm and dry.     Capillary Refill: Capillary refill takes less than 2 seconds.  Neurological:     Mental Status: He is alert. Mental status is at baseline.  Psychiatric:        Mood and Affect: Mood normal.        Behavior: Behavior normal.     ED Results / Procedures / Treatments   Labs (all labs ordered are listed, but only abnormal results are displayed) Labs Reviewed  BASIC METABOLIC PANEL  CBC WITH DIFFERENTIAL/PLATELET  TROPONIN I (HIGH SENSITIVITY)    EKG EKG Interpretation  Date/Time:  Tuesday Jul 24 2020 17:11:46 EDT Ventricular Rate:  64 PR Interval:  144 QRS Duration: 90 QT Interval:  366 QTC Calculation: 377 R Axis:   88 Text Interpretation: Normal sinus rhythm Benign early repolarization Normal  ECG No significant change since last tracing Confirmed by 06-15-1973 [phone]) on 07/24/2020 5:16:28 PM   Radiology DG Chest 2 View  Result Date: 07/24/2020 CLINICAL DATA:  30 year old male with chest pain EXAM: CHEST - 2 VIEW COMPARISON:  Chest radiograph dated 08/05/2019. FINDINGS: The heart size and mediastinal contours are within normal limits. Both lungs are clear. The visualized skeletal structures are unremarkable. IMPRESSION: No active cardiopulmonary disease. Electronically Signed   By: 10/05/2019 M.D.   On: 07/24/2020 18:19    Procedures Procedures   Medications Ordered in ED Medications  alum & mag hydroxide-simeth (MAALOX/MYLANTA) 200-200-20  MG/5ML suspension 30 mL (30 mLs Oral Given 07/24/20 1808)    And  lidocaine (XYLOCAINE) 2 % viscous mouth solution 15 mL (15 mLs Oral Given 07/24/20 1808)  famotidine (PEPCID) tablet 20 mg (20 mg Oral Given 07/24/20 1808)    ED Course  I have reviewed the triage vital signs and the nursing notes.  Pertinent labs & imaging results that were available during my care of the patient were reviewed by me and considered in my medical decision making (see chart for details).    MDM Rules/Calculators/A&P                          Patient is very atypical sounding chest pain.  He believes it is from his reflux although given that he is rating his chest pain 8/10 we will obtain basic lab work chest x-ray EKG.  Physical exam is unremarkable.  I doubt dissection, patient is PERC negative and I have low suspicion for PE, doubt tamponade, pneumothorax or other acute medical condition.  EKG with benign early repull pattern.  No ST-T wave abnormalities of note.  Some inferior Q waves but no significant changes in prior EKGs.  Chest x-ray unremarkable.  I personally reviewed all laboratory work and imaging.  Metabolic panel without any acute abnormality specifically kidney function within normal limits and no significant electrolyte  abnormalities. CBC without leukocytosis or significant anemia.  Troponin x1 within normal limits.  Patient reassessed he feels pain is 8/10 pain has improved to 2/10 after GI cocktail and Pepcid.  Will discharge home with Pepcid and hydroxyzine question of anxiety versus reflux.  Given strict return precautions given the concerning features of chest pain he will follow-up with PCP.  Return precautions given and understood.  Final Clinical Impression(s) / ED Diagnoses Final diagnoses:  Chest pain, unspecified type    Rx / DC Orders ED Discharge Orders         Ordered    famotidine (PEPCID) 20 MG tablet  2 times daily        07/24/20 1906    hydrOXYzine (ATARAX/VISTARIL) 25 MG tablet  Every 6 hours        07/24/20 1906           Gailen Shelter, [place] 07/25/20 0018    Pollyann Savoy, MD 07/25/20 [phone]

## 2020-07-24 NOTE — ED Provider Notes (Signed)
[facility] EMERGENCY DEPARTMENT Provider Note   CSN: [id number] Arrival date & time: 07/24/20  1703     History Chief Complaint  Patient presents with  . Chest Pain    Eric Cisneros is a 30 y.o. male.  HPI Patient is a 30 year old male with past medical history significant for reflux, HLD, tachycardia  Patient presented today with 2 days of sternal/left-sided chest pain that is achy he describes it as sharp at times seems to be somewhat worse with eating.  He states it feels quite similar to reflux-like pain that he has had in the past he states he has used some Tums without relief.  He has been prescribed Pepcid in the past but after running out of this medication he has not get any additional medication appears that he has not taken any and approximately 2 years.  He denies any nausea or vomiting.  He describes the pain as 8/10 nonradiating.  It is not exertional seems to be somewhat associated with deep breathing but states he is not short of breath.  Denies any abdominal pain vomiting or changes with bowel movements.  No urinary symptoms.  He states he additionally also feels that he is some increased stressors in his life presently.  Denies any SI, HI, AVH.     Past Medical History:  Diagnosis Date  . GERD (gastroesophageal reflux disease)   . Hyperlipidemia   . Tachycardia     Patient Active Problem List   Diagnosis Date Noted  . Atypical chest pain 05/29/2015  . Gastroesophageal reflux disease without esophagitis 10/25/2014  . Visit for preventive health examination 07/21/2014  . Immunity status testing 07/21/2014  . Routine general medical examination at a health care facility 08/01/2011  . Cerumen impaction 08/01/2011    Past Surgical History:  Procedure Laterality Date  . NO PAST SURGERIES         Family History  Problem Relation Age of Onset  . Hyperlipidemia Mother   . Hypertension Father     Social History   Tobacco Use  . Smoking  status: Never Smoker  . Smokeless tobacco: Never Used  Substance Use Topics  . Alcohol use: Not Currently    Alcohol/week: 0.0 standard drinks    Comment: Occasionally  . Drug use: Not Currently    Frequency: 1.0 times per week    Types: Marijuana    Home Medications Prior to Admission medications   Medication Sig Start Date End Date Taking? Authorizing Provider  famotidine (PEPCID) 20 MG tablet Take 1 tablet (20 mg total) by mouth 2 (two) times daily. 07/24/20 08/23/20 Yes Idamay Hosein S, PA  hydrOXYzine (ATARAX/VISTARIL) 25 MG tablet Take 1 tablet (25 mg total) by mouth every 6 (six) hours. 07/24/20 08/23/20 Yes Ryker Sudbury, Stevphen Meuse S, PA  acetaminophen (TYLENOL) 500 MG tablet Take 500 mg by mouth every 6 (six) hours as needed for mild pain.    [provider]  ferrous sulfate 325 (65 FE) MG tablet Take 325 mg by mouth daily with breakfast.    [provider]  HYDROcodone-acetaminophen (NORCO/VICODIN) 5-325 MG tablet Take 1 tablet by mouth every 6 (six) hours as needed for severe pain. 11/11/18   Ward, Chase Picket, PA-C  ibuprofen (ADVIL) 600 MG tablet Take 1 tablet (600 mg total) by mouth every 6 (six) hours as needed. 11/11/18   Ward, Chase Picket, PA-C  naproxen (NAPROSYN) 375 MG tablet Take 1 tablet (375 mg total) by mouth 2 (two) times daily with a meal.  08/05/19   Pricilla Loveless, MD  omeprazole (PRILOSEC) 20 MG capsule Take 1 capsule (20 mg total) by mouth daily. 05/30/16 11/10/18  Muthersbaugh, Dahlia Client, PA-C    Allergies    Patient has no known allergies.  Review of Systems   Review of Systems  Constitutional: Negative for chills and fever.  HENT: Negative for congestion.   Eyes: Negative for pain.  Respiratory: Negative for cough and shortness of breath.   Cardiovascular: Positive for chest pain. Negative for leg swelling.  Gastrointestinal: Negative for abdominal pain, diarrhea, nausea and vomiting.  Genitourinary: Negative for dysuria.  Musculoskeletal: Negative  for myalgias.  Skin: Negative for rash.  Neurological: Negative for dizziness and headaches.    Physical Exam Updated Vital Signs BP 103/71   Pulse (!) 58   Temp 98.7 F (37.1 C) (Oral)   Resp 16   Ht 5\' 9"  (1.753 m)   Wt 68 kg   SpO2 100%   BMI 22.15 kg/m   Physical Exam Vitals and nursing note reviewed.  Constitutional:      General: He is not in acute distress.    Comments: Pleasant well-appearing 30 year old.  In no acute distress.  Sitting comfortably in bed.  Able answer questions appropriately follow commands. No increased work of breathing. Speaking in full sentences.  HENT:     Head: Normocephalic and atraumatic.     Nose: Nose normal.     Mouth/Throat:     Mouth: Mucous membranes are moist.  Eyes:     General: No scleral icterus. Cardiovascular:     Rate and Rhythm: Normal rate and regular rhythm.     Pulses: Normal pulses.     Heart sounds: Normal heart sounds.  Pulmonary:     Effort: Pulmonary effort is normal. No respiratory distress.     Breath sounds: No wheezing.  Abdominal:     Palpations: Abdomen is soft.     Tenderness: There is no abdominal tenderness. There is no right CVA tenderness, left CVA tenderness, guarding or rebound.  Musculoskeletal:     Cervical back: Normal range of motion.     Right lower leg: No edema.     Left lower leg: No edema.  Skin:    General: Skin is warm and dry.     Capillary Refill: Capillary refill takes less than 2 seconds.  Neurological:     Mental Status: He is alert. Mental status is at baseline.  Psychiatric:        Mood and Affect: Mood normal.        Behavior: Behavior normal.     ED Results / Procedures / Treatments   Labs (all labs ordered are listed, but only abnormal results are displayed) Labs Reviewed  BASIC METABOLIC PANEL  CBC WITH DIFFERENTIAL/PLATELET  TROPONIN I (HIGH SENSITIVITY)    EKG EKG Interpretation  Date/Time:  Tuesday Jul 24 2020 17:11:46 EDT Ventricular Rate:  64 PR  Interval:  144 QRS Duration: 90 QT Interval:  366 QTC Calculation: 377 R Axis:   88 Text Interpretation: Normal sinus rhythm Benign early repolarization Normal ECG No significant change since last tracing Confirmed by 1973 [phone]) on 07/24/2020 5:16:28 PM   Radiology DG Chest 2 View  Result Date: 07/24/2020 CLINICAL DATA:  30 year old male with chest pain EXAM: CHEST - 2 VIEW COMPARISON:  Chest radiograph dated 08/05/2019. FINDINGS: The heart size and mediastinal contours are within normal limits. Both lungs are clear. The visualized skeletal structures are unremarkable. IMPRESSION: No active cardiopulmonary disease.  Electronically Signed   By: Elgie Collard M.D.   On: 07/24/2020 18:19    Procedures Procedures   Medications Ordered in ED Medications  alum & mag hydroxide-simeth (MAALOX/MYLANTA) 200-200-20 MG/5ML suspension 30 mL (30 mLs Oral Given 07/24/20 1808)    And  lidocaine (XYLOCAINE) 2 % viscous mouth solution 15 mL (15 mLs Oral Given 07/24/20 1808)  famotidine (PEPCID) tablet 20 mg (20 mg Oral Given 07/24/20 1808)    ED Course  I have reviewed the triage vital signs and the nursing notes.  Pertinent labs & imaging results that were available during my care of the patient were reviewed by me and considered in my medical decision making (see chart for details).    MDM Rules/Calculators/A&P                          Patient 30 year old male who is PERC negative presented today with chest pain that he describes as similar to reflux he has had in the past.  Is been going on for 2 days.  Seems to be nonradiating sternal or left-sided chest pain he describes as sharp occasionally pleuritic denies any exertional symptoms  No recent surgeries, hospitalization, long travel, hemoptysis, estrogen containing OCP, cancer history.  No unilateral leg swelling.  No history of PE or VTE.   CBC ovalocytosis or anemia.  BMP unremarkable.  Troponin x1 within normal meds.  EKG  normal sinus rhythm with no ST-T wave abnormalities of note.  Chest x-ray without abnormality.  Physical exam is also reassuring.  No abnormalities noted.  Patient tolerated p.o. without difficulty during ER visit.  On reassessment patient's pain has gone from 8/10-2/10 with GI cocktail and Pepcid.  He has had no new symptoms during ER evaluation denies any new or concerning symptoms.  States he feels much improved.  We did a lengthy discussion about anxiety and how he needs to closely follow-up with a PCP to have first-line anxiety medication prescribed if needed.  Will discharge home with Pepcid and Vistaril.  Return precautions given.  Patient is agreeable to plan.  Final Clinical Impression(s) / ED Diagnoses Final diagnoses:  Chest pain, unspecified type    Rx / DC Orders ED Discharge Orders         Ordered    famotidine (PEPCID) 20 MG tablet  2 times daily        07/24/20 1906    hydrOXYzine (ATARAX/VISTARIL) 25 MG tablet  Every 6 hours        07/24/20 1906           Gailen Shelter, [place] 07/24/20 1937    Pollyann Savoy, MD 07/24/20 [phone]

## 2020-07-24 NOTE — ED Triage Notes (Signed)
Stabbing pain in his left chest x 2 days. Hx of GERD that caused the same type pain.

## 2020-07-24 NOTE — Discharge Instructions (Addendum)
Your work up today was without any abnormalities.  Overall I'm quite reassured by your story and exam.   Overall I have a very high suspicion for this being anxiety related or reflux related.  To this end I am treating for hydroxyzine and use this 3 times daily or just at night as needed for anxiety.  Please drink plenty of water.  I have also prescribed you Pepcid.  Please follow-up with your primary care provider.  If your symptoms persist you may benefit from omeprazole which is a step towards a stronger medication.  As recommend using Tylenol 1000 mg every 6 hours as needed for pain but generally avoiding ibuprofen as this could irritate your stomach lining.

## 2020-08-12 IMAGING — CR DG CHEST 2V
2 series · 2 of 2 positions shown · non-contrast
Comparison: 05/29/2016

CLINICAL DATA: Chest pain

EXAM:
CHEST - 2 VIEW

[w chest pa]
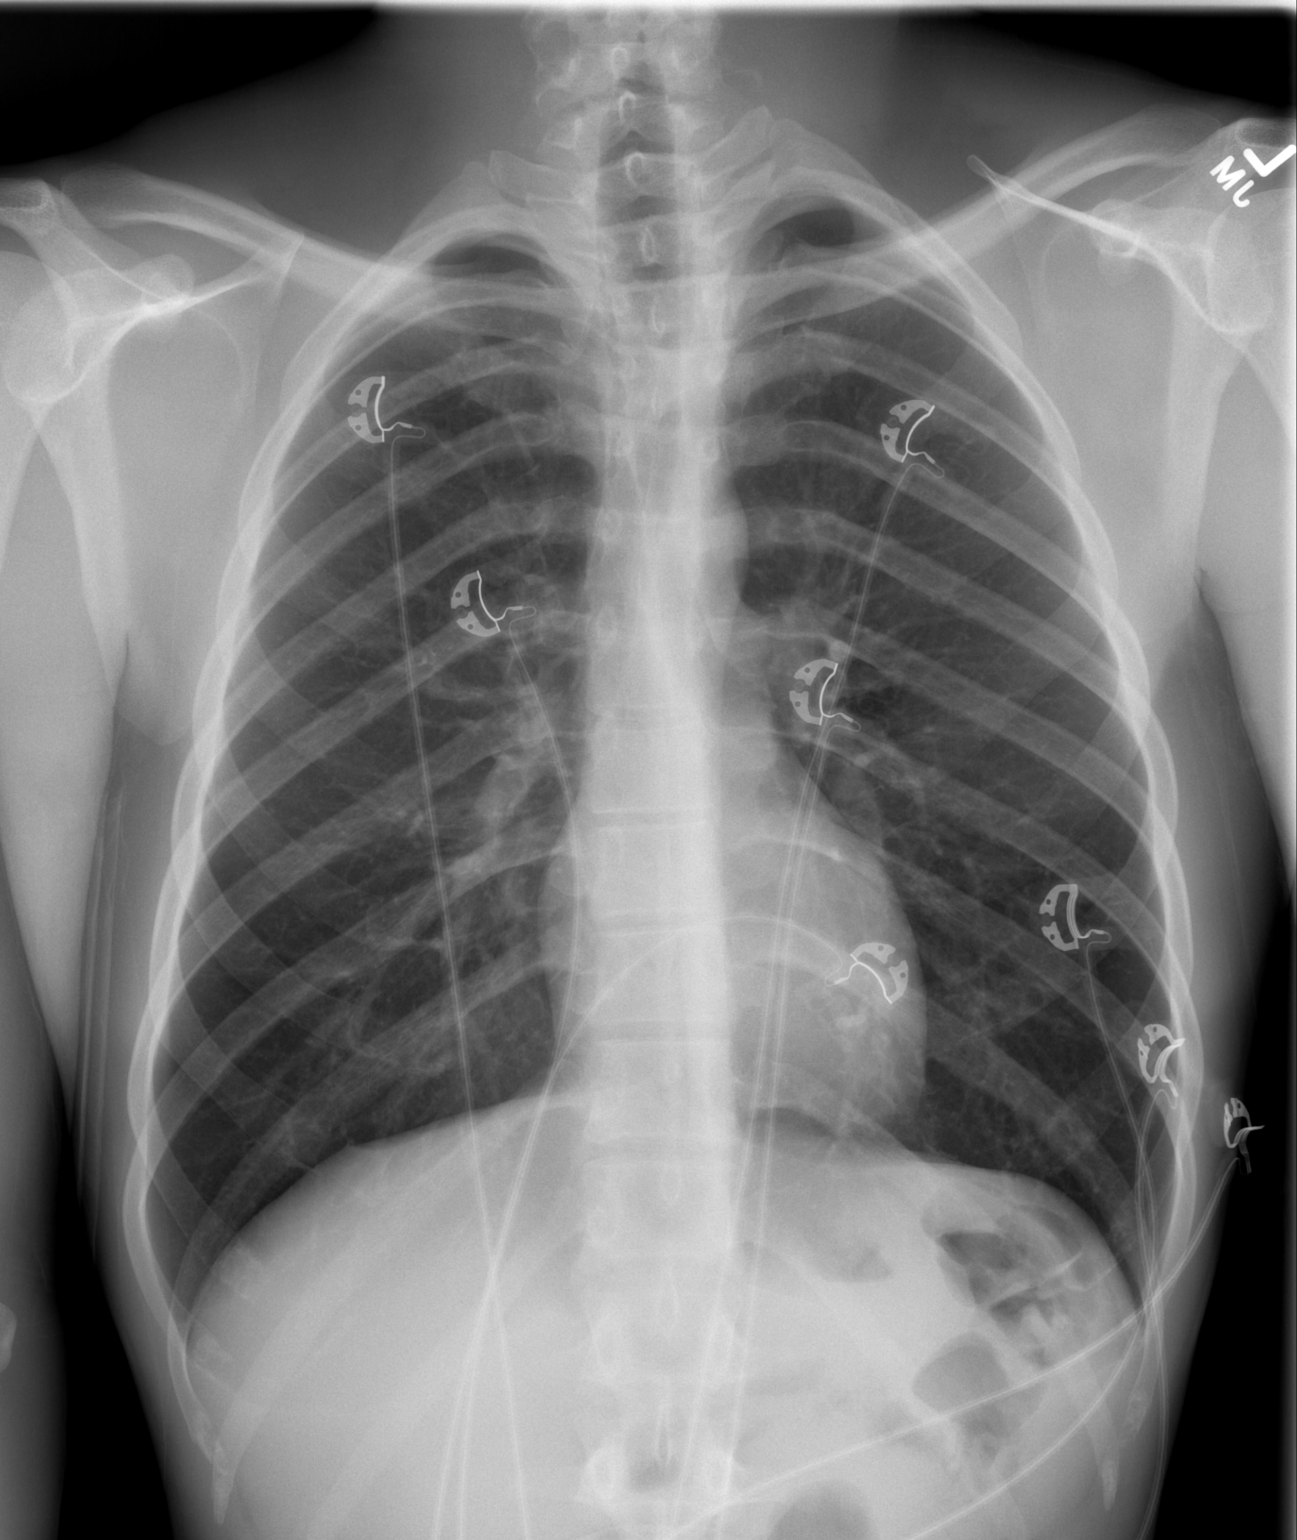

[w chest lat]
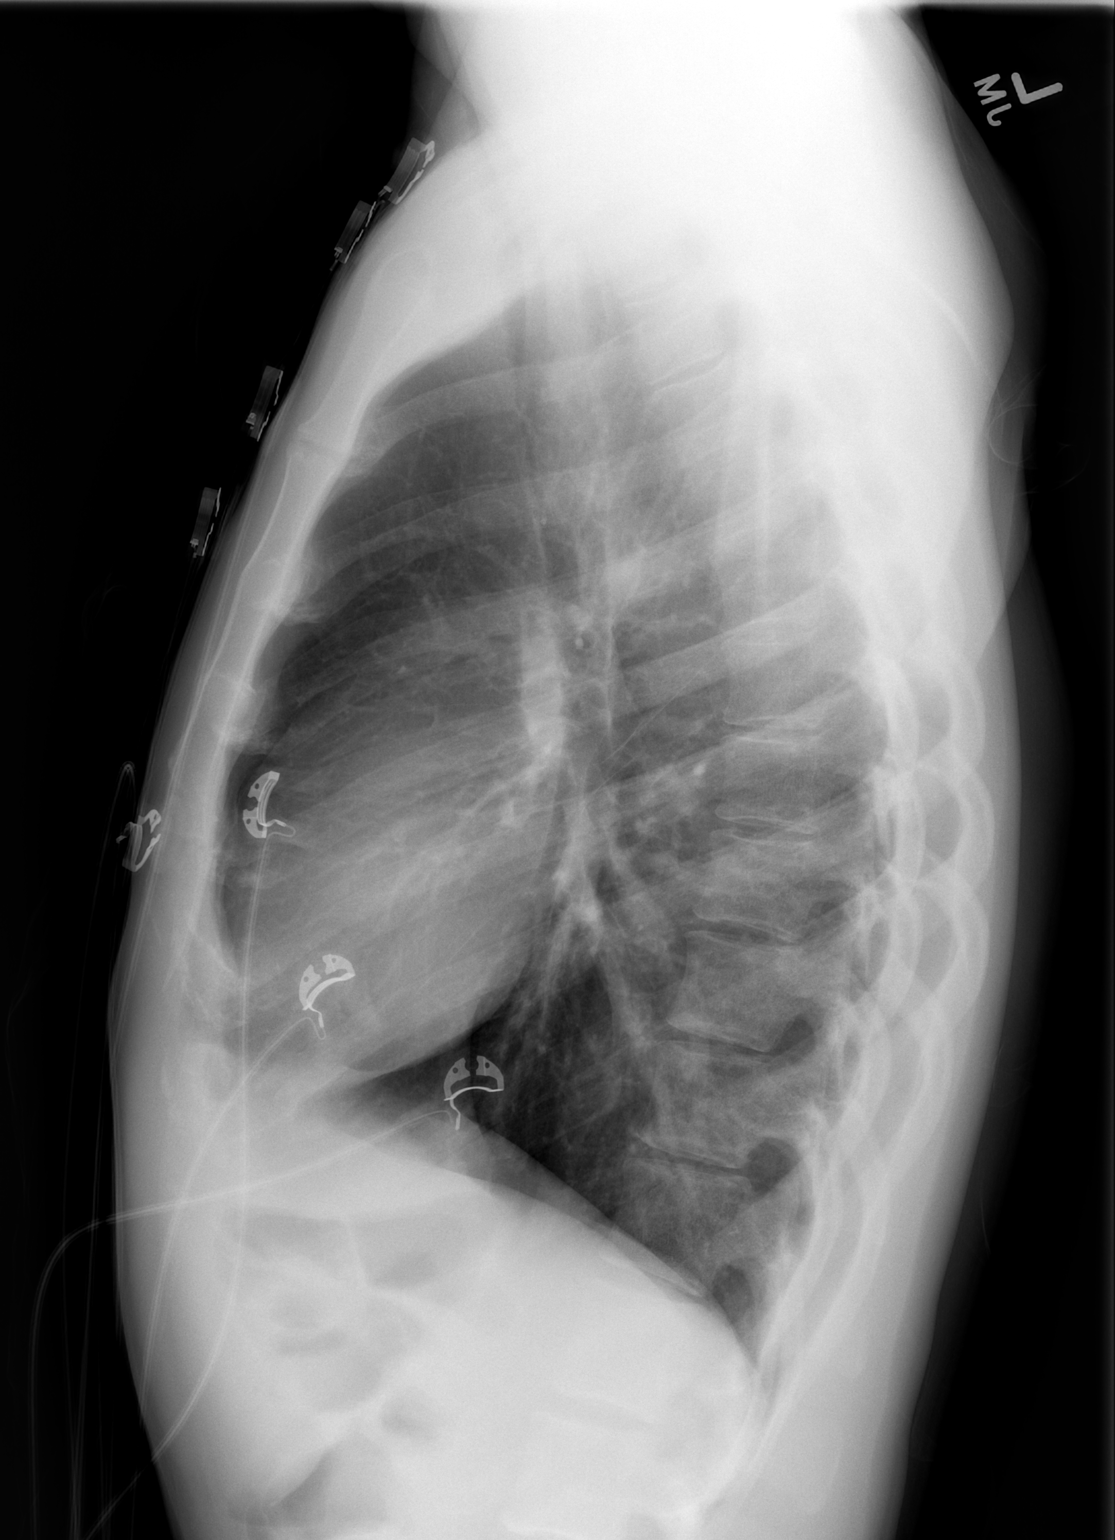

[2 of 2 positions shown; findings below may reference images not displayed]

FINDINGS: The heart size and mediastinal contours are within normal limits.
Both lungs are clear. The visualized skeletal structures are
unremarkable.
IMPRESSION: No acute abnormality of the lungs.

## 2020-11-09 ENCOUNTER — Other Ambulatory Visit: Payer: Self-pay

## 2020-11-09 ENCOUNTER — Encounter (HOSPITAL_BASED_OUTPATIENT_CLINIC_OR_DEPARTMENT_OTHER): Payer: Self-pay | Admitting: Emergency Medicine

## 2020-11-09 ENCOUNTER — Emergency Department (HOSPITAL_BASED_OUTPATIENT_CLINIC_OR_DEPARTMENT_OTHER)
Admission: EM | Admit: 2020-11-09 | Discharge: 2020-11-09 | Disposition: A | Discharge status: Home / Self Care | Payer: Self-pay | Attending: Emergency Medicine | Admitting: Emergency Medicine

## 2020-11-09 DIAGNOSIS — K219 Gastro-esophageal reflux disease without esophagitis: Secondary | ICD-10-CM | POA: Insufficient documentation

## 2020-11-09 DIAGNOSIS — R066 Hiccough: Secondary | ICD-10-CM | POA: Insufficient documentation

## 2020-11-09 MED ORDER — PANTOPRAZOLE SODIUM 20 MG PO TBEC: 20.0000 mg | DELAYED_RELEASE_TABLET | Freq: Every day | ORAL | 0 refills | Status: DC

## 2020-11-09 MED ORDER — METOCLOPRAMIDE HCL 10 MG PO TABS: 10.0000 mg | ORAL_TABLET | Freq: Four times a day (QID) | ORAL | 0 refills | Status: AC | PRN

## 2020-11-09 MED ORDER — ALUM & MAG HYDROXIDE-SIMETH 200-200-20 MG/5ML PO SUSP
30.0000 mL | Freq: Once | ORAL | Status: AC
  Administered 2020-11-09: 30 mL via ORAL
  Filled 2020-11-09: qty 30

## 2020-11-09 MED ORDER — LIDOCAINE VISCOUS HCL 2 % MT SOLN
15.0000 mL | Freq: Once | OROMUCOSAL | Status: AC
  Administered 2020-11-09: 15 mL via ORAL
  Filled 2020-11-09: qty 15

## 2020-11-09 NOTE — ED Triage Notes (Signed)
Reports hiccups intermittently for the last few days.  Vomited X 2.  Currently does not have symptoms.

## 2020-11-09 NOTE — ED Provider Notes (Signed)
[facility] EMERGENCY DEPARTMENT Provider Note   CSN: [id number] Arrival date & time: 11/09/20  1606     History Chief Complaint  Patient presents with  . Hiccups  . Vomiting    Eric Cisneros is a 30 y.o. male.  Patient with history of GERD presents the emergency department today for evaluation of hiccups spells.  Patient has had intermittent spells, lasting about an hour at a time, over the past couple of days.  He has not had this problem in the past.  Currently symptoms are resolved.  The episodes are violent enough that he vomited a couple of times.  No chest pain or shortness of breath.  No lower extremity swelling or history of blood clots.  No cough or fever.  He does not currently take medication for GERD but has had medications in the past. The onset of this condition was acute. Aggravating factors: none. Alleviating factors: none.        Past Medical History:  Diagnosis Date  . GERD (gastroesophageal reflux disease)   . Hyperlipidemia   . Tachycardia     Patient Active Problem List   Diagnosis Date Noted  . Atypical chest pain 05/29/2015  . Gastroesophageal reflux disease without esophagitis 10/25/2014  . Visit for preventive health examination 07/21/2014  . Immunity status testing 07/21/2014  . Routine general medical examination at a health care facility 08/01/2011  . Cerumen impaction 08/01/2011    Past Surgical History:  Procedure Laterality Date  . NO PAST SURGERIES         Family History  Problem Relation Age of Onset  . Hyperlipidemia Mother   . Hypertension Father     Social History   Tobacco Use  . Smoking status: Never  . Smokeless tobacco: Never  Substance Use Topics  . Alcohol use: Not Currently    Alcohol/week: 0.0 standard drinks    Comment: Occasionally  . Drug use: Not Currently    Frequency: 1.0 times per week    Types: Marijuana    Home Medications Prior to Admission medications   Medication Sig Start Date  End Date Taking? Authorizing Provider  metoCLOPramide (REGLAN) 10 MG tablet Take 1 tablet (10 mg total) by mouth every 6 (six) hours as needed for nausea or vomiting (or hiccups). 11/09/20  Yes Renne Crigler, PA-C  pantoprazole (PROTONIX) 20 MG tablet Take 1 tablet (20 mg total) by mouth daily. 11/09/20  Yes Renne Crigler, PA-C  acetaminophen (TYLENOL) 500 MG tablet Take 500 mg by mouth every 6 (six) hours as needed for mild pain.    [provider]  ferrous sulfate 325 (65 FE) MG tablet Take 325 mg by mouth daily with breakfast.    [provider]  omeprazole (PRILOSEC) 20 MG capsule Take 1 capsule (20 mg total) by mouth daily. 05/30/16 11/10/18  Muthersbaugh, Dahlia Client, PA-C    Allergies    Patient has no known allergies.  Review of Systems   Review of Systems  Constitutional:  Negative for fever.  Respiratory:  Negative for cough and shortness of breath.   Cardiovascular:  Negative for chest pain.  Gastrointestinal:  Positive for nausea and vomiting. Negative for abdominal pain.   Physical Exam Updated Vital Signs BP 103/65 (BP Location: Left Arm)   Pulse 63   Temp 98.2 F (36.8 C) (Oral)   Resp 18   Ht 5\' 9"  (1.753 m)   Wt 68 kg   SpO2 99%   BMI 22.15 kg/m   Physical Exam  Vitals and nursing note reviewed.  Constitutional:      Appearance: He is well-developed.  HENT:     Head: Normocephalic and atraumatic.  Eyes:     Conjunctiva/sclera: Conjunctivae normal.  Cardiovascular:     Rate and Rhythm: Normal rate.     Pulses: Normal pulses.  Pulmonary:     Effort: No respiratory distress.  Abdominal:     Tenderness: There is no abdominal tenderness. There is no guarding or rebound.  Musculoskeletal:     Cervical back: Normal range of motion and neck supple.  Skin:    General: Skin is warm and dry.  Neurological:     Mental Status: He is alert.    ED Results / Procedures / Treatments   Labs (all labs ordered are listed, but only abnormal results are  displayed) Labs Reviewed - No data to display  EKG None  Radiology No results found.  Procedures Procedures   Medications Ordered in ED Medications  alum & mag hydroxide-simeth (MAALOX/MYLANTA) 200-200-20 MG/5ML suspension 30 mL (has no administration in time range)    And  lidocaine (XYLOCAINE) 2 % viscous mouth solution 15 mL (has no administration in time range)    ED Course  I have reviewed the triage vital signs and the nursing notes.  Pertinent labs & imaging results that were available during my care of the patient were reviewed by me and considered in my medical decision making (see chart for details).  Patient seen and examined.  No current symptoms.  We will give GI cocktail and prescription for Protonix daily and Reglan as needed.   Vital signs reviewed and are as follows: BP 103/65 (BP Location: Left Arm)   Pulse 63   Temp 98.2 F (36.8 C) (Oral)   Resp 18   Ht 5\' 9"  (1.753 m)   Wt 68 kg   SpO2 99%   BMI 22.15 kg/m   Patient urged to return with worsening symptoms or other concerns. Patient verbalized understanding and agrees with plan.      MDM Rules/Calculators/A&P                           Patient here for hiccups spells leading to vomiting.  He does have a history of GERD.  This is most likely the underlying cause.  No chest pain or shortness of breath.  No signs of blood clots and low suspicion for cardiopulmonary cause.  Patient appears comfortable.    Final Clinical Impression(s) / ED Diagnoses Final diagnoses:  Hiccups  Gastroesophageal reflux disease without esophagitis    Rx / DC Orders ED Discharge Orders          Ordered    pantoprazole (PROTONIX) 20 MG tablet  Daily        11/09/20 1658    metoCLOPramide (REGLAN) 10 MG tablet  Every 6 hours PRN        11/09/20 1658             11/11/20, PA-C 11/09/20 1703    11/11/20, MD 11/10/20 0002

## 2020-11-09 NOTE — Discharge Instructions (Signed)
Please read and follow all provided instructions.  Your diagnoses today include:  1. Hiccups   2. Gastroesophageal reflux disease without esophagitis     Tests performed today include: Vital signs. See below for your results today.   Medications prescribed:  Pantoprazole - acid blocking medication  Metoclopramide -antinausea medication that can be tried for hiccups  Take any prescribed medications only as directed.  Home care instructions:  Follow any educational materials contained in this packet.  BE VERY CAREFUL not to take multiple medicines containing Tylenol (also called acetaminophen). Doing so can lead to an overdose which can damage your liver and cause liver failure and possibly death.   Follow-up instructions: Please follow-up with your primary care provider as needed for further evaluation of your symptoms.   Return instructions:  Please return to the Emergency Department if you experience worsening symptoms.  Please return if you have any other emergent concerns.  Additional Information:  Your vital signs today were: BP 103/65 (BP Location: Left Arm)   Pulse 63   Temp 98.2 F (36.8 C) (Oral)   Resp 18   Ht 5\' 9"  (1.753 m)   Wt 68 kg   SpO2 99%   BMI 22.15 kg/m  If your blood pressure (BP) was elevated above 135/85 this visit, please have this repeated by your doctor within one month. --------------

## 2021-02-15 ENCOUNTER — Emergency Department (HOSPITAL_BASED_OUTPATIENT_CLINIC_OR_DEPARTMENT_OTHER)
Admission: EM | Admit: 2021-02-15 | Discharge: 2021-02-15 | Disposition: A | Discharge status: Home / Self Care | Payer: Self-pay | Attending: Emergency Medicine | Admitting: Emergency Medicine

## 2021-02-15 ENCOUNTER — Other Ambulatory Visit (HOSPITAL_BASED_OUTPATIENT_CLINIC_OR_DEPARTMENT_OTHER): Payer: Self-pay

## 2021-02-15 ENCOUNTER — Encounter (HOSPITAL_BASED_OUTPATIENT_CLINIC_OR_DEPARTMENT_OTHER): Payer: Self-pay | Admitting: *Deleted

## 2021-02-15 ENCOUNTER — Other Ambulatory Visit: Payer: Self-pay

## 2021-02-15 DIAGNOSIS — K921 Melena: Secondary | ICD-10-CM | POA: Insufficient documentation

## 2021-02-15 LAB — COMPREHENSIVE METABOLIC PANEL
ALT: 11 U/L (ref 0–44)
AST: 19 U/L (ref 15–41)
Albumin: 4.4 g/dL (ref 3.5–5.0)
Alkaline Phosphatase: 45 U/L (ref 38–126)
Anion gap: 8 (ref 5–15)
BUN: 12 mg/dL (ref 6–20)
CO2: 26 mmol/L (ref 22–32)
Calcium: 9.3 mg/dL (ref 8.9–10.3)
Chloride: 101 mmol/L (ref 98–111)
Creatinine, Ser: 1.02 mg/dL (ref 0.61–1.24)
GFR, Estimated: >60 mL/min (ref >60)
Glucose, Bld: 97 mg/dL (ref 70–99)
Potassium: 4 mmol/L (ref 3.5–5.1)
Sodium: 135 mmol/L (ref 135–145)
Total Bilirubin: 0.8 mg/dL (ref 0.3–1.2)
Total Protein: 8.1 g/dL (ref 6.5–8.1)

## 2021-02-15 LAB — CBC WITH DIFFERENTIAL/PLATELET
Abs Immature Granulocytes: 0.01 10*3/uL (ref 0.00–0.07)
Basophils Absolute: 0 10*3/uL (ref 0.0–0.1)
Basophils Relative: 1 %
Eosinophils Absolute: 0.1 10*3/uL (ref 0.0–0.5)
Eosinophils Relative: 1 %
HCT: 46 % (ref 39.0–52.0)
Hemoglobin: 15.3 g/dL (ref 13.0–17.0)
Immature Granulocytes: 0 %
Lymphocytes Relative: 26 %
Lymphs Abs: 1.3 10*3/uL (ref 0.7–4.0)
MCH: 28.3 pg (ref 26.0–34.0)
MCHC: 33.3 g/dL (ref 30.0–36.0)
MCV: 85.2 fL (ref 80.0–100.0)
Monocytes Absolute: 0.4 10*3/uL (ref 0.1–1.0)
Monocytes Relative: 8 %
Neutro Abs: 3.2 10*3/uL (ref 1.7–7.7)
Neutrophils Relative %: 64 %
Platelets: 177 10*3/uL (ref 150–400)
RBC: 5.4 MIL/uL (ref 4.22–5.81)
RDW: 13.3 % (ref 11.5–15.5)
WBC: 5 10*3/uL (ref 4.0–10.5)
nRBC: 0 % (ref 0.0–0.2)

## 2021-02-15 LAB — LIPASE, BLOOD: Lipase: 36 U/L (ref 11–51)

## 2021-02-15 MED ORDER — LIDOCAINE VISCOUS HCL 2 % MT SOLN
15.0000 mL | Freq: Once | OROMUCOSAL | Status: AC
  Administered 2021-02-15: 16:00:00 15 mL via ORAL
  Filled 2021-02-15: qty 15

## 2021-02-15 MED ORDER — ALUM & MAG HYDROXIDE-SIMETH 200-200-20 MG/5ML PO SUSP
30.0000 mL | Freq: Once | ORAL | Status: AC
  Administered 2021-02-15: 16:00:00 30 mL via ORAL
  Filled 2021-02-15: qty 30

## 2021-02-15 MED ORDER — PANTOPRAZOLE SODIUM 20 MG PO TBEC
20.0000 mg | DELAYED_RELEASE_TABLET | Freq: Every day | ORAL | 0 refills | Status: AC
  Filled 2021-02-15: qty 30, 30d supply, fill #0

## 2021-02-15 NOTE — ED Notes (Signed)
Discharge instructions discussed with pt. Pt verbalized understanding. Pt stable and ambulatory.  °

## 2021-02-15 NOTE — Discharge Instructions (Addendum)
There is a referral to gastroenterology attached to these papers.  It is important to see them about potential bleeding in your GI tract.  Because I was unable to do a rectal exam, I am unable to tell you whether or not there is blood in your stool.  Please take the Protonix at the pharmacy for your symptoms.  It is also important to avoid medications such as ibuprofen and naproxen, as well is alcohol.  These will further irritate your stomach lining.

## 2021-02-15 NOTE — ED Triage Notes (Signed)
Abdominal pain for a week.  

## 2021-02-15 NOTE — ED Provider Notes (Signed)
[facility] EMERGENCY DEPARTMENT Provider Note   CSN: [id number] Arrival date & time: 02/15/21  1457     History Chief Complaint  Patient presents with   Abdominal Pain    Eric Cisneros is a 30 y.o. male with a past medical history of GERD presenting today with a complaint of abdominal pain and bright red blood in his stool for 2 days.  Also endorsing dark and bloody spit.  Usually takes Pepto-Bismol and Tums for his reflux however has not taking these recently.  Pain is worse with foods, specifically sauces and pastas.  Denies any history of hemorrhoids or GI bleeding.  No excessive NSAID use nor alcohol use.  Never had any surgeries.  Past Medical History:  Diagnosis Date   GERD (gastroesophageal reflux disease)    Hyperlipidemia    Tachycardia     Patient Active Problem List   Diagnosis Date Noted   Atypical chest pain 05/29/2015   Gastroesophageal reflux disease without esophagitis 10/25/2014   Visit for preventive health examination 07/21/2014   Immunity status testing 07/21/2014   Routine general medical examination at a health care facility 08/01/2011   Cerumen impaction 08/01/2011    Past Surgical History:  Procedure Laterality Date   NO PAST SURGERIES         Family History  Problem Relation Age of Onset   Hyperlipidemia Mother    Hypertension Father     Social History   Tobacco Use   Smoking status: Never   Smokeless tobacco: Never  Vaping Use   Vaping Use: Never used  Substance Use Topics   Alcohol use: Not Currently    Alcohol/week: 0.0 standard drinks    Comment: Occasionally   Drug use: Not Currently    Frequency: 1.0 times per week    Types: Marijuana    Home Medications Prior to Admission medications   Medication Sig Start Date End Date Taking? Authorizing Provider  acetaminophen (TYLENOL) 500 MG tablet Take 500 mg by mouth every 6 (six) hours as needed for mild pain.    [provider]  ferrous sulfate 325 (65  FE) MG tablet Take 325 mg by mouth daily with breakfast.    [provider]  metoCLOPramide (REGLAN) 10 MG tablet Take 1 tablet (10 mg total) by mouth every 6 (six) hours as needed for nausea or vomiting (or hiccups). 11/09/20   Renne Crigler, PA-C  pantoprazole (PROTONIX) 20 MG tablet Take 1 tablet (20 mg total) by mouth daily. 11/09/20   Renne Crigler, PA-C  omeprazole (PRILOSEC) 20 MG capsule Take 1 capsule (20 mg total) by mouth daily. 05/30/16 11/10/18  Muthersbaugh, Dahlia Client, PA-C    Allergies    Patient has no known allergies.  Review of Systems   Review of Systems  Constitutional:  Negative for chills and fever.  Gastrointestinal:  Positive for abdominal pain, blood in stool and nausea. Negative for vomiting.  Genitourinary:  Negative for dysuria.  All other systems reviewed and are negative.  Physical Exam Updated Vital Signs BP 124/80 (BP Location: Right Arm)    Pulse 78    Temp 98.2 F (36.8 C) (Oral)    Resp 16    Ht 5\' 9"  (1.753 m)    Wt 68 kg    SpO2 100%    BMI 22.14 kg/m   Physical Exam Vitals and nursing note reviewed.  Constitutional:      Appearance: Normal appearance.  HENT:     Head: Normocephalic and atraumatic.  Mouth/Throat:     Mouth: Mucous membranes are moist.     Pharynx: Oropharynx is clear.  Eyes:     General: No scleral icterus.    Conjunctiva/sclera: Conjunctivae normal.  Cardiovascular:     Rate and Rhythm: Normal rate and regular rhythm.  Pulmonary:     Effort: Pulmonary effort is normal. No respiratory distress.     Breath sounds: No wheezing.  Abdominal:     General: Abdomen is flat.     Tenderness: There is generalized abdominal tenderness.  Skin:    General: Skin is warm and dry.     Findings: No rash.  Neurological:     Mental Status: He is alert.  Psychiatric:        Mood and Affect: Mood normal.    ED Results / Procedures / Treatments   Labs (all labs ordered are listed, but only abnormal results are displayed) Labs  Reviewed  COMPREHENSIVE METABOLIC PANEL  CBC WITH DIFFERENTIAL/PLATELET  LIPASE, BLOOD    EKG None  Radiology No results found.  Procedures Procedures   Medications Ordered in ED Medications - No data to display  ED Course  I have reviewed the triage vital signs and the nursing notes.  Pertinent labs & imaging results that were available during my care of the patient were reviewed by me and considered in my medical decision making (see chart for details).    MDM Rules/Calculators/A&P 30 year old man with a history of reflux presenting with a complaint of hematochezia and spitting up blood.  He denied a rectal exam today.  This made it difficult for me to assess for any GI bleeding initiated his visit to begin with.    Lab work was without abnormalities, no sign of anemia.  Abdomen is diffusely tender, more so in epigastric area.  Suspicion for ulcer, esophagitis, gastritis or other source of GI bleed.  I do not believe a CT scan will be beneficial nor indicated at this time.  Referral placed to gastroenterology for further investigation of the source of bleeding at this time.  Patient requesting a work note for both yesterday and today.  Final Clinical Impression(s) / ED Diagnoses Final diagnoses:  Hematochezia    Rx / DC Orders Results and diagnoses were explained to the patient. Return precautions discussed in full. Patient had no additional questions and expressed complete understanding.     Saddie Benders, PA-C 02/15/21 1841    Charlynne Pander, MD 02/15/21 [phone]

## 2021-02-19 ENCOUNTER — Other Ambulatory Visit (HOSPITAL_BASED_OUTPATIENT_CLINIC_OR_DEPARTMENT_OTHER): Payer: Self-pay

## 2021-03-01 ENCOUNTER — Other Ambulatory Visit (HOSPITAL_BASED_OUTPATIENT_CLINIC_OR_DEPARTMENT_OTHER): Payer: Self-pay

## 2021-05-30 ENCOUNTER — Emergency Department (HOSPITAL_BASED_OUTPATIENT_CLINIC_OR_DEPARTMENT_OTHER)
Admission: EM | Admit: 2021-05-30 | Discharge: 2021-05-30 | Disposition: A | Discharge status: Home / Self Care | Payer: Self-pay | Attending: Emergency Medicine | Admitting: Emergency Medicine

## 2021-05-30 ENCOUNTER — Other Ambulatory Visit: Payer: Self-pay

## 2021-05-30 ENCOUNTER — Encounter (HOSPITAL_BASED_OUTPATIENT_CLINIC_OR_DEPARTMENT_OTHER): Payer: Self-pay | Admitting: Emergency Medicine

## 2021-05-30 ENCOUNTER — Emergency Department (HOSPITAL_BASED_OUTPATIENT_CLINIC_OR_DEPARTMENT_OTHER): Payer: Self-pay

## 2021-05-30 DIAGNOSIS — R0789 Other chest pain: Secondary | ICD-10-CM | POA: Insufficient documentation

## 2021-05-30 DIAGNOSIS — R778 Other specified abnormalities of plasma proteins: Secondary | ICD-10-CM | POA: Insufficient documentation

## 2021-05-30 LAB — CBC WITH DIFFERENTIAL/PLATELET
Abs Immature Granulocytes: 0.02 10*3/uL (ref 0.00–0.07)
Basophils Absolute: 0 10*3/uL (ref 0.0–0.1)
Basophils Relative: 1 %
Eosinophils Absolute: 0.1 10*3/uL (ref 0.0–0.5)
Eosinophils Relative: 1 %
HCT: 43.8 % (ref 39.0–52.0)
Hemoglobin: 14.9 g/dL (ref 13.0–17.0)
Immature Granulocytes: 0 %
Lymphocytes Relative: 15 %
Lymphs Abs: 1.1 10*3/uL (ref 0.7–4.0)
MCH: 28.5 pg (ref 26.0–34.0)
MCHC: 34 g/dL (ref 30.0–36.0)
MCV: 83.9 fL (ref 80.0–100.0)
Monocytes Absolute: 0.5 10*3/uL (ref 0.1–1.0)
Monocytes Relative: 7 %
Neutro Abs: 5.4 10*3/uL (ref 1.7–7.7)
Neutrophils Relative %: 76 %
Platelets: 171 10*3/uL (ref 150–400)
RBC: 5.22 MIL/uL (ref 4.22–5.81)
RDW: 13.2 % (ref 11.5–15.5)
WBC: 7.1 10*3/uL (ref 4.0–10.5)
nRBC: 0 % (ref 0.0–0.2)

## 2021-05-30 LAB — COMPREHENSIVE METABOLIC PANEL
ALT: 19 U/L (ref 0–44)
AST: 22 U/L (ref 15–41)
Albumin: 4.3 g/dL (ref 3.5–5.0)
Alkaline Phosphatase: 44 U/L (ref 38–126)
Anion gap: 6 (ref 5–15)
BUN: 11 mg/dL (ref 6–20)
CO2: 25 mmol/L (ref 22–32)
Calcium: 9.5 mg/dL (ref 8.9–10.3)
Chloride: 106 mmol/L (ref 98–111)
Creatinine, Ser: 1.16 mg/dL (ref 0.61–1.24)
GFR, Estimated: >60 mL/min (ref >60)
Glucose, Bld: 97 mg/dL (ref 70–99)
Potassium: 4.1 mmol/L (ref 3.5–5.1)
Sodium: 137 mmol/L (ref 135–145)
Total Bilirubin: 1 mg/dL (ref 0.3–1.2)
Total Protein: 7.8 g/dL (ref 6.5–8.1)

## 2021-05-30 LAB — SEDIMENTATION RATE: Sed Rate: 7 mm/hr (ref 0–16)

## 2021-05-30 LAB — C-REACTIVE PROTEIN: CRP: 0.5 mg/dL (ref <1.0)

## 2021-05-30 LAB — TROPONIN I (HIGH SENSITIVITY): Troponin I (High Sensitivity): 4 ng/L (ref <18)

## 2021-05-30 MED ORDER — HYDROCODONE-ACETAMINOPHEN 5-325 MG PO TABS
1.0000 | ORAL_TABLET | Freq: Once | ORAL | Status: AC
  Administered 2021-05-30: 1 via ORAL
  Filled 2021-05-30: qty 1

## 2021-05-30 NOTE — ED Provider Notes (Signed)
?[facility] EMERGENCY DEPARTMENT ?Provider Note ? ? ?CSN: [id number] ?Arrival date & time: 05/30/21  1730 ? ?  ? ?History ? ?Chief Complaint  ?Patient presents with  ? Chest Pain  ? ? ?Eric Cisneros is a 31 y.o. male.  Patient presents with central chest pain that has been ongoing for 3 days which is worse with movement and palpation.  Patient states that his pain is worse with leaning forward.  The patient has taken no medication at home for the pain.  He rates the pain is 9 out of 10.  He denies radiation of symptoms.  Denies nausea, vomiting, abdominal pain.  Denies shortness of breath.  PMH significant for history of tachycardia, GERD, Hyperlipidemia ? ?HPI ? ?  ? ?Home Medications ?Prior to Admission medications   ?Medication Sig Start Date End Date Taking? Authorizing Provider  ?acetaminophen (TYLENOL) 500 MG tablet Take 500 mg by mouth every 6 (six) hours as needed for mild pain.    [provider]  ?ferrous sulfate 325 (65 FE) MG tablet Take 325 mg by mouth daily with breakfast.    [provider]  ?metoCLOPramide (REGLAN) 10 MG tablet Take 1 tablet (10 mg total) by mouth every 6 (six) hours as needed for nausea or vomiting (or hiccups). 11/09/20   Renne Crigler, PA-C  ?pantoprazole (PROTONIX) 20 MG tablet Take 1 tablet (20 mg total) by mouth daily. 02/15/21   Redwine, Madison A, PA-C  ?omeprazole (PRILOSEC) 20 MG capsule Take 1 capsule (20 mg total) by mouth daily. 05/30/16 11/10/18  Muthersbaugh, Dahlia Client, PA-C  ?   ? ?Allergies    ?Patient has no known allergies.   ? ?Review of Systems   ?Review of Systems  ?Constitutional:  Negative for fever.  ?Respiratory:  Negative for shortness of breath.   ?Cardiovascular:  Positive for chest pain.  ?Gastrointestinal:  Negative for abdominal pain, nausea and vomiting.  ? ?Physical Exam ?Updated Vital Signs ?BP 123/70 (BP Location: Right Arm)   Pulse 66   Temp 98.5 ?F (36.9 ?C) (Oral)   Resp 18   SpO2 98%  ?Physical Exam ?Constitutional:    ?   General: He is not in acute distress. ?HENT:  ?   Head: Normocephalic.  ?Eyes:  ?   Pupils: Pupils are equal, round, and reactive to light.  ?Cardiovascular:  ?   Rate and Rhythm: Normal rate and regular rhythm.  ?   Heart sounds: Normal heart sounds.  ?  No friction rub.  ?Pulmonary:  ?   Effort: Pulmonary effort is normal.  ?   Breath sounds: Normal breath sounds.  ?Chest:  ?   Chest wall: Tenderness present.  ?Abdominal:  ?   Palpations: Abdomen is soft.  ?Musculoskeletal:  ?   Cervical back: Normal range of motion.  ?Skin: ?   General: Skin is warm and dry.  ?Neurological:  ?   Mental Status: He is alert.  ? ? ?ED Results / Procedures / Treatments   ?Labs ?(all labs ordered are listed, but only abnormal results are displayed) ?Labs Reviewed  ?COMPREHENSIVE METABOLIC PANEL  ?CBC WITH DIFFERENTIAL/PLATELET  ?C-REACTIVE PROTEIN  ?SEDIMENTATION RATE  ?TROPONIN I (HIGH SENSITIVITY)  ? ? ?EKG ?EKG Interpretation ? ?Date/Time:  Thursday May 30 2021 17:39:50 EDT ?Ventricular Rate:  63 ?PR Interval:  152 ?QRS Duration: 83 ?QT Interval:  375 ?QTC Calculation: 384 ?R Axis:   85 ?Text Interpretation: Sinus rhythm Benign early repolarization Confirmed by Virgina Norfolk [phone]) on 05/30/2021 5:52:29 PM ? ?  Radiology ?DG Chest 2 View ? ?Result Date: 05/30/2021 ?CLINICAL DATA:  Chest pain.  History of smoking. EXAM: CHEST - 2 VIEW COMPARISON:  None. FINDINGS: The heart size and mediastinal contours are within normal limits. 07/24/2020 the visualized skeletal structures are unremarkable. IMPRESSION: No active cardiopulmonary disease. Electronically Signed   By: Irish Lack M.D.   On: 05/30/2021 18:09   ? ?Procedures ?Procedures  ? ?Medications Ordered in ED ?Medications  ?HYDROcodone-acetaminophen (NORCO/VICODIN) 5-325 MG per tablet 1 tablet (has no administration in time range)  ? ? ?ED Course/ Medical Decision Making/ A&P ?  ?                        ?Medical Decision Making ?Amount and/or Complexity of Data Reviewed ?Labs:  ordered. ?Radiology: ordered. ? ?Risk ?Prescription drug management. ? ? ?Patient presents with central chest pain.  Differential includes but is not limited to musculoskeletal pain, pericarditis, ACS, and others ? ?The patient's pain is worse with movement and the patient's chest is tender in the area affected to palpation.  ACS is unlikely.  EKG shows no ischemic changes.  Troponin 4. ? ?The patient's pain is worse with leaning forward and no friction rub is auscultated.  The patient has tenderness to palpation and pain is worse with movement.  I feel that pericarditis is unlikely at this time.  CRP and sed rate are pending. ? ?I ordered and interpreted a chest x-ray.  No acute disease was noted.  I agree with the radiologist findings. ? ?This is likely musculoskeletal pain.  The patient states is worse with movement.  It is easily reproducible with movement and palpation.  I will recommend NSAIDs to the patient.  I see no reason for further evaluation or work-up at this time of the emergent level ? ?Final Clinical Impression(s) / ED Diagnoses ?Final diagnoses:  ?Chest wall pain  ? ? ?Rx / DC Orders ?ED Discharge Orders   ? ? None  ? ?  ? ? ?  ?Darrick Grinder, PA-C ?05/30/21 1840 ? ?  ?Virgina Norfolk, DO ?05/30/21 1844 ? ?

## 2021-05-30 NOTE — Discharge Instructions (Signed)
You were seen today for chest pain.  Work-up is reassuring for no cardiac involvement at this time.  I recommend NSAIDs for inflammation.  You may take ibuprofen or Aleve.  Recommend follow-up with a PCP.  Ifno PCP, recommend establishing care with a primary care provider to help with chronic issues ?

## 2021-05-30 NOTE — ED Triage Notes (Signed)
Pt reports central chest pain x 3 days that is worse with movement and with palpation.  ?

## 2021-05-30 NOTE — ED Notes (Signed)
Patient transported to X-ray 

## 2022-01-20 ENCOUNTER — Emergency Department (HOSPITAL_BASED_OUTPATIENT_CLINIC_OR_DEPARTMENT_OTHER)
Admission: EM | Admit: 2022-01-20 | Discharge: 2022-01-20 | Disposition: A | Discharge status: Home / Self Care | Payer: Self-pay | Attending: Emergency Medicine | Admitting: Emergency Medicine

## 2022-01-20 ENCOUNTER — Emergency Department (HOSPITAL_BASED_OUTPATIENT_CLINIC_OR_DEPARTMENT_OTHER): Payer: Self-pay

## 2022-01-20 ENCOUNTER — Other Ambulatory Visit: Payer: Self-pay

## 2022-01-20 ENCOUNTER — Encounter (HOSPITAL_BASED_OUTPATIENT_CLINIC_OR_DEPARTMENT_OTHER): Payer: Self-pay | Admitting: Urology

## 2022-01-20 DIAGNOSIS — X501XXA Overexertion from prolonged static or awkward postures, initial encounter: Secondary | ICD-10-CM | POA: Insufficient documentation

## 2022-01-20 DIAGNOSIS — Y9367 Activity, basketball: Secondary | ICD-10-CM | POA: Insufficient documentation

## 2022-01-20 DIAGNOSIS — S93601A Unspecified sprain of right foot, initial encounter: Secondary | ICD-10-CM | POA: Insufficient documentation

## 2022-01-20 DIAGNOSIS — S93401A Sprain of unspecified ligament of right ankle, initial encounter: Secondary | ICD-10-CM | POA: Insufficient documentation

## 2022-01-20 NOTE — Discharge Instructions (Signed)
Recommend ice, 1000 mg of Tylenol every 6 hours as needed for pain, 400 mg ibuprofen every 8 hours as needed for pain.  Recommend using walking boot with activity as tolerated.  Follow-up with sports medicine doctor.

## 2022-01-20 NOTE — ED Provider Notes (Signed)
[facility] EMERGENCY DEPARTMENT Provider Note   CSN: [id number] Arrival date & time: 01/20/22  1434     History  Chief Complaint  Patient presents with   Foot Injury    Eric Cisneros is a 31 y.o. male.  Patient with pain in the right foot after rolling his ankle and foot last night playing bass well.  Has been ambulatory but with pain.  Denies any other pain.  No pain in the knee or upper part of the leg.  Did not hit his head or lose consciousness.  Nothing makes it worse or better.  He is noticing swelling.  He put an Ace wrap on.  The history is provided by the patient.       Home Medications Prior to Admission medications   Medication Sig Start Date End Date Taking? Authorizing Provider  acetaminophen (TYLENOL) 500 MG tablet Take 500 mg by mouth every 6 (six) hours as needed for mild pain.    [provider]  ferrous sulfate 325 (65 FE) MG tablet Take 325 mg by mouth daily with breakfast.    [provider]  metoCLOPramide (REGLAN) 10 MG tablet Take 1 tablet (10 mg total) by mouth every 6 (six) hours as needed for nausea or vomiting (or hiccups). 11/09/20   Renne Crigler, PA-C  pantoprazole (PROTONIX) 20 MG tablet Take 1 tablet (20 mg total) by mouth daily. 02/15/21   Redwine, Madison A, PA-C  omeprazole (PRILOSEC) 20 MG capsule Take 1 capsule (20 mg total) by mouth daily. 05/30/16 11/10/18  Muthersbaugh, Dahlia Client, PA-C      Allergies    Patient has no known allergies.    Review of Systems   Review of Systems  Physical Exam Updated Vital Signs BP 111/65 (BP Location: Left Arm)   Pulse 63   Temp 98 F (36.7 C)   Resp 17   Ht 5\' 9"  (1.753 m)   Wt 65.8 kg   SpO2 100%   BMI 21.41 kg/m  Physical Exam Constitutional:      General: He is not in acute distress.    Appearance: He is not ill-appearing.  HENT:     Head: Normocephalic.     Nose: Nose normal.     Mouth/Throat:     Mouth: Mucous membranes are moist.  Cardiovascular:      Rate and Rhythm: Normal rate.     Pulses: Normal pulses.     Heart sounds: Normal heart sounds.  Musculoskeletal:     Cervical back: Normal range of motion.     Comments: Swelling to the right foot, tender to the top of the right foot, there is no real tenderness to the ankle or upper tibia/fibula  Neurological:     General: No focal deficit present.     Mental Status: He is alert.     Motor: No weakness.     ED Results / Procedures / Treatments   Labs (all labs ordered are listed, but only abnormal results are displayed) Labs Reviewed - No data to display  EKG None  Radiology DG Foot Complete Right  Result Date: 01/20/2022 CLINICAL DATA:  Twisting injury to the right foot while playing basketball EXAM: RIGHT FOOT COMPLETE - 3 VIEW COMPARISON:  None Available. FINDINGS: There is no evidence of fracture or dislocation. There is no evidence of arthropathy or other focal bone abnormality. Soft tissues are unremarkable. IMPRESSION: No acute fracture or dislocation. Electronically Signed   By: 01/22/2022 M.D.   On:  01/20/2022 15:38    Procedures Procedures    Medications Ordered in ED Medications - No data to display  ED Course/ Medical Decision Making/ A&P                           Medical Decision Making Amount and/or Complexity of Data Reviewed Radiology: ordered.   Eric Cisneros is here with right foot pain.  Rolled his ankle and foot last night playing basketball.  Normal vitals.  No fever.  Differential diagnosis is sprain versus fracture.  Patient has some mild swelling of the right foot with tenderness.  Does not have a lot of tenderness in the ankle or upper portion of the leg.  X-ray of the foot shows no acute fracture of the foot or of the distal ankle.  Overall suspect sprain.  He is neurovascular neuromuscular intact otherwise.  Recommend ice, Tylenol, ibuprofen.  Will place him in a walking boot and have him follow-up with sports medicine.  Discharged in good  condition.  This chart was dictated using voice recognition software.  Despite best efforts to proofread,  errors can occur which can change the documentation meaning.         Final Clinical Impression(s) / ED Diagnoses Final diagnoses:  Sprain of right foot, initial encounter  Sprain of right ankle, unspecified ligament, initial encounter    Rx / DC Orders ED Discharge Orders     None         Virgina Norfolk, DO 01/20/22 1709

## 2022-01-20 NOTE — ED Triage Notes (Signed)
Pt states was playing basketball last night and twisted right foot  Unable to bear weight at this time

## 2024-01-28 NOTE — Addendum Note (Signed)
 Encounter addended by: Orlinda Franne FALCON, RN on: 01/28/2024 8:36 AM  Actions taken: Letter saved
